# Patient Record
Sex: Male | Born: 1970 | Race: White | Hispanic: No | Marital: Married | Smoking: Never smoker
Health system: Southern US, Community
[De-identification: ages and names within clinical notes are randomized; demographics above are authoritative.]

## PROBLEM LIST (undated history)

## (undated) DIAGNOSIS — F419 Anxiety disorder, unspecified: Secondary | ICD-10-CM

## (undated) DIAGNOSIS — R3129 Other microscopic hematuria: Secondary | ICD-10-CM

## (undated) DIAGNOSIS — N201 Calculus of ureter: Secondary | ICD-10-CM

## (undated) DIAGNOSIS — R161 Splenomegaly, not elsewhere classified: Secondary | ICD-10-CM

## (undated) DIAGNOSIS — N200 Calculus of kidney: Secondary | ICD-10-CM

## (undated) DIAGNOSIS — E781 Pure hyperglyceridemia: Secondary | ICD-10-CM

## (undated) DIAGNOSIS — F329 Major depressive disorder, single episode, unspecified: Secondary | ICD-10-CM

## (undated) DIAGNOSIS — N289 Disorder of kidney and ureter, unspecified: Secondary | ICD-10-CM

## (undated) DIAGNOSIS — G2581 Restless legs syndrome: Secondary | ICD-10-CM

## (undated) DIAGNOSIS — R1011 Right upper quadrant pain: Secondary | ICD-10-CM

## (undated) DIAGNOSIS — J45909 Unspecified asthma, uncomplicated: Secondary | ICD-10-CM

## (undated) DIAGNOSIS — E669 Obesity, unspecified: Secondary | ICD-10-CM

## (undated) DIAGNOSIS — E785 Hyperlipidemia, unspecified: Secondary | ICD-10-CM

## (undated) DIAGNOSIS — H8110 Benign paroxysmal vertigo, unspecified ear: Secondary | ICD-10-CM

## (undated) DIAGNOSIS — G4730 Sleep apnea, unspecified: Secondary | ICD-10-CM

## (undated) DIAGNOSIS — R0982 Postnasal drip: Secondary | ICD-10-CM

## (undated) DIAGNOSIS — F32A Depression, unspecified: Secondary | ICD-10-CM

## (undated) DIAGNOSIS — R319 Hematuria, unspecified: Secondary | ICD-10-CM

## (undated) HISTORY — DX: Depression, unspecified: F32.A

## (undated) HISTORY — DX: Benign paroxysmal vertigo, unspecified ear: H81.10

## (undated) HISTORY — DX: Obesity, unspecified: E66.9

## (undated) HISTORY — DX: Sleep apnea, unspecified: G47.30

## (undated) HISTORY — DX: Unspecified asthma, uncomplicated: J45.909

## (undated) HISTORY — PX: LITHOTRIPSY: SUR834

## (undated) HISTORY — DX: Major depressive disorder, single episode, unspecified: F32.9

## (undated) HISTORY — DX: Right upper quadrant pain: R10.11

## (undated) HISTORY — DX: Postnasal drip: R09.82

## (undated) HISTORY — DX: Splenomegaly, not elsewhere classified: R16.1

## (undated) HISTORY — DX: Calculus of ureter: N20.1

## (undated) HISTORY — DX: Other microscopic hematuria: R31.29

## (undated) HISTORY — DX: Anxiety disorder, unspecified: F41.9

## (undated) HISTORY — DX: Hyperlipidemia, unspecified: E78.5

## (undated) HISTORY — DX: Calculus of kidney: N20.0

## (undated) HISTORY — DX: Restless legs syndrome: G25.81

## (undated) HISTORY — DX: Hematuria, unspecified: R31.9

## (undated) HISTORY — DX: Pure hyperglyceridemia: E78.1

---
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
## 1988-05-01 HISTORY — PX: TONSILLECTOMY: SUR1361

## 1988-05-01 HISTORY — PX: SINUS SURGERY WITH INSTATRAK: SHX5215

## 2004-05-01 HISTORY — PX: OTHER SURGICAL HISTORY: SHX169

## 2006-07-12 ENCOUNTER — Ambulatory Visit: Payer: Self-pay | Admitting: Gastroenterology

## 2006-07-26 ENCOUNTER — Ambulatory Visit: Payer: Self-pay | Admitting: Gastroenterology

## 2009-07-19 ENCOUNTER — Ambulatory Visit: Payer: Self-pay | Admitting: Specialist

## 2010-09-22 ENCOUNTER — Ambulatory Visit: Payer: Self-pay | Admitting: Otolaryngology

## 2012-04-19 ENCOUNTER — Ambulatory Visit: Payer: Self-pay | Admitting: Family Medicine

## 2012-04-22 ENCOUNTER — Emergency Department: Payer: Self-pay | Admitting: Emergency Medicine

## 2012-04-22 LAB — CBC WITH DIFFERENTIAL/PLATELET
Eosinophil %: 1.3 %
HCT: 45.7 % (ref 40.0–52.0)
HGB: 15.7 g/dL (ref 13.0–18.0)
Lymphocyte #: 1.3 10*3/uL (ref 1.0–3.6)
MCV: 82 fL (ref 80–100)
Monocyte %: 13.7 %
Neutrophil #: 4.3 10*3/uL (ref 1.4–6.5)
Platelet: 222 10*3/uL (ref 150–440)
RBC: 5.55 10*6/uL (ref 4.40–5.90)
WBC: 6.6 10*3/uL (ref 3.8–10.6)

## 2012-04-22 LAB — COMPREHENSIVE METABOLIC PANEL
Albumin: 4.1 g/dL (ref 3.4–5.0)
Anion Gap: 7 (ref 7–16)
Calcium, Total: 9 mg/dL (ref 8.5–10.1)
Chloride: 107 mmol/L (ref 98–107)
Co2: 26 mmol/L (ref 21–32)
Creatinine: 0.94 mg/dL (ref 0.60–1.30)
EGFR (African American): >60
EGFR (Non-African Amer.): >60
Osmolality: 279 (ref 275–301)
SGOT(AST): 27 U/L (ref 15–37)

## 2012-04-22 LAB — URINALYSIS, COMPLETE
Bilirubin,UR: NEGATIVE
Glucose,UR: NEGATIVE mg/dL (ref 0–75)
Ketone: NEGATIVE
Nitrite: NEGATIVE
Specific Gravity: 1.023 (ref 1.003–1.030)
WBC UR: 1 /HPF (ref 0–5)

## 2012-05-21 ENCOUNTER — Ambulatory Visit: Payer: Self-pay | Admitting: Surgery

## 2012-06-10 ENCOUNTER — Ambulatory Visit: Payer: Self-pay | Admitting: Unknown Physician Specialty

## 2012-06-12 LAB — PATHOLOGY REPORT

## 2013-12-27 IMAGING — US ABDOMEN ULTRASOUND
1 series · 14 of 25 positions shown · non-contrast
Comparison: none

REASON FOR EXAM: RUQ pain
COMMENTS:

[Series 1: abdomen ultrasound · 0.33mm/px · 14 of 133 slices shown]
[im 1/133]
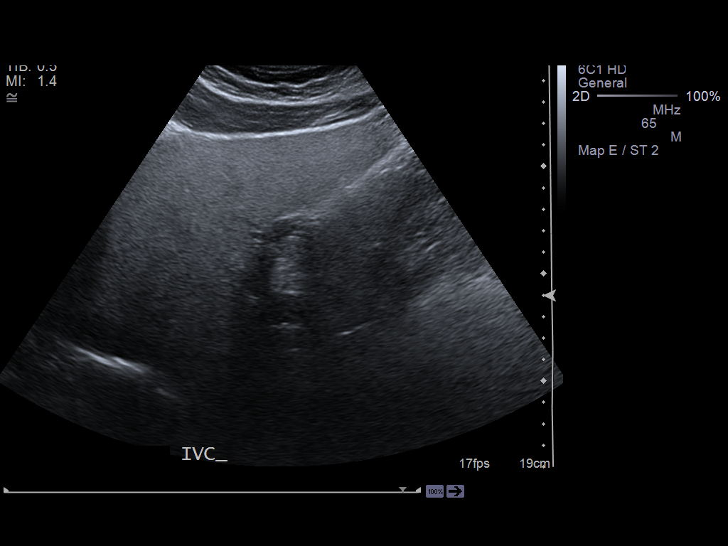
[im 12/133]
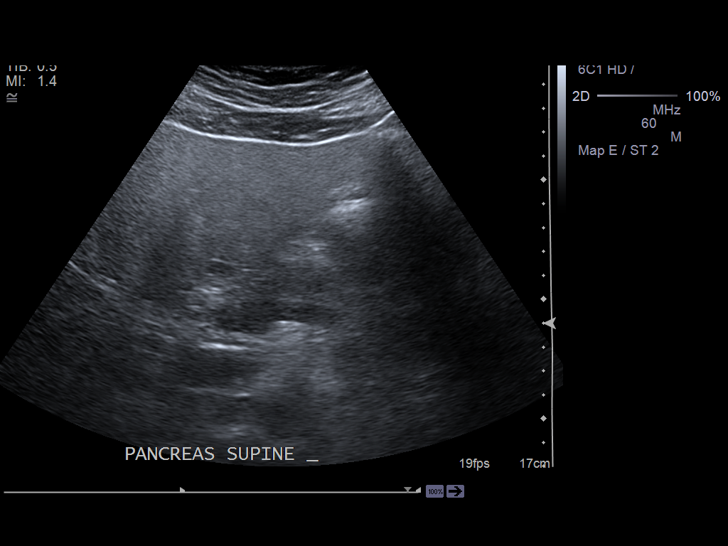
[im 23/133]
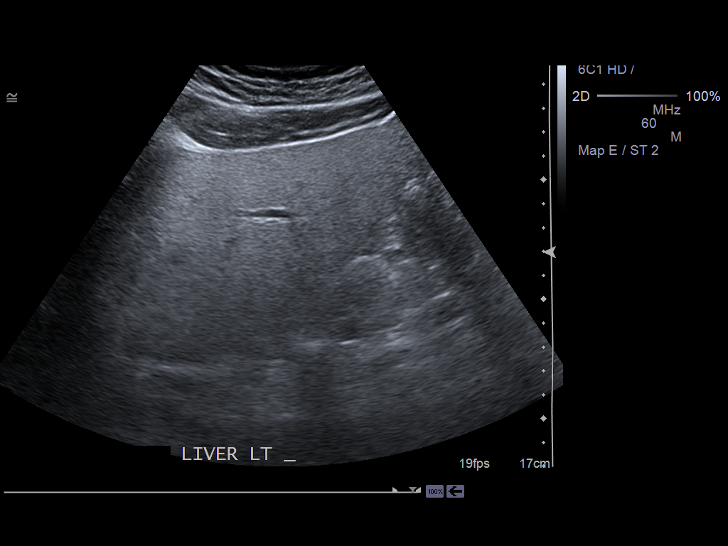
[im 34/133]
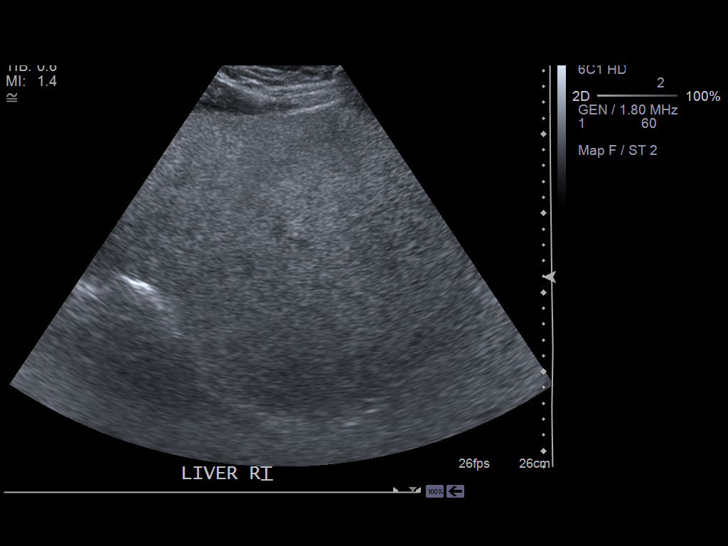
[im 45/133]
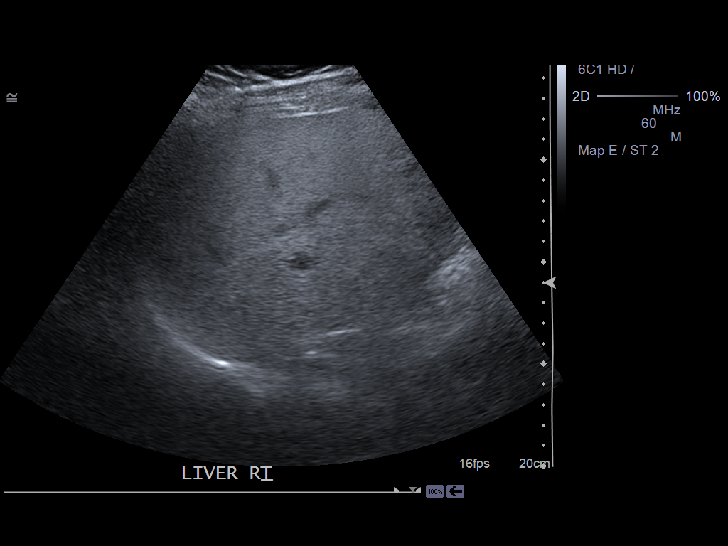
[im 50/133]
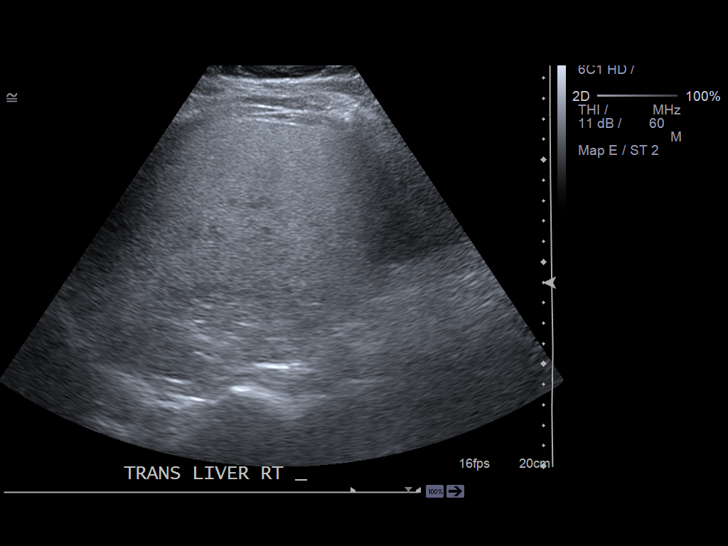
[im 61/133]
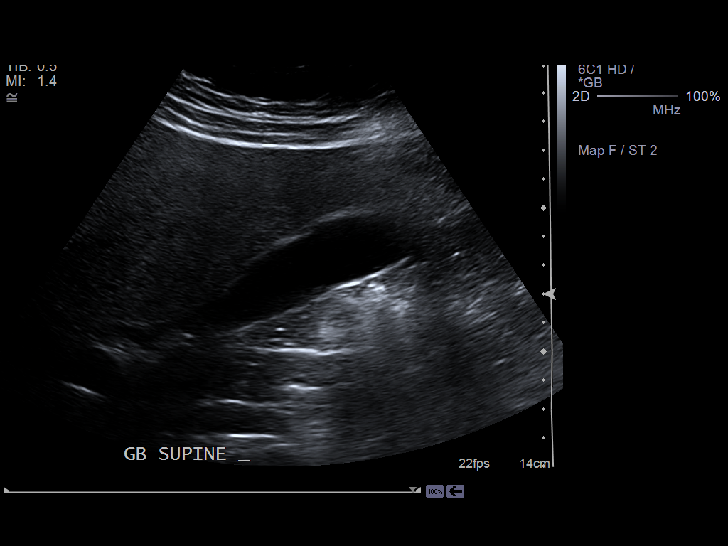
[im 72/133]
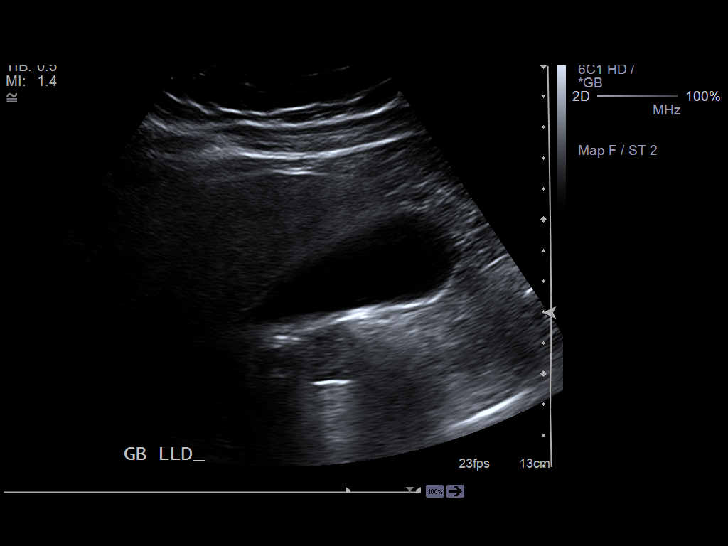
[im 83/133]
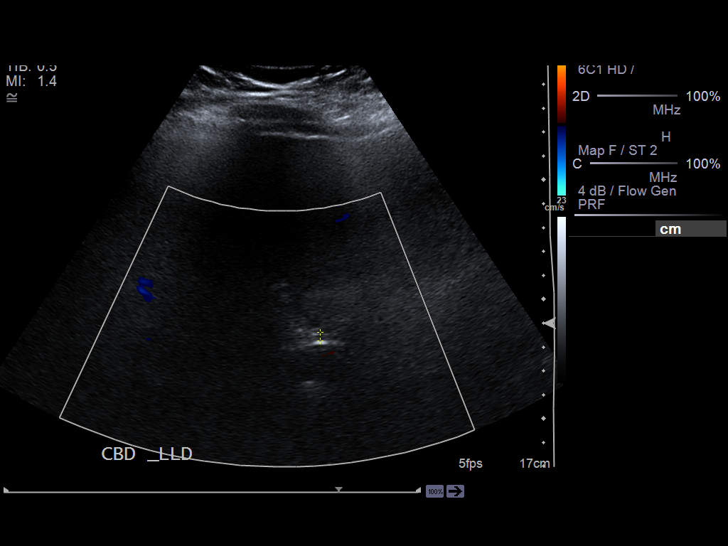
[im 89/133]
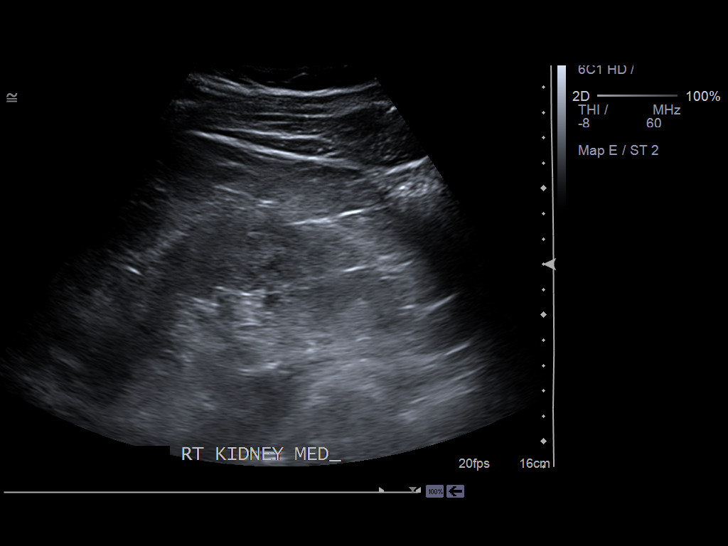
[im 100/133]
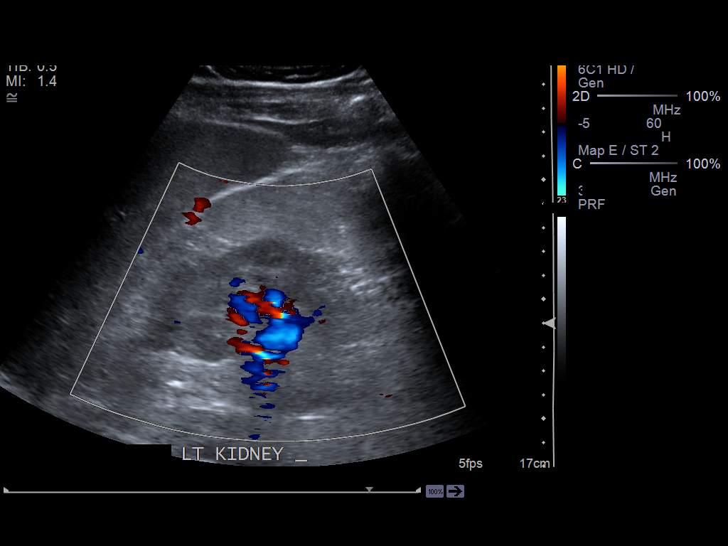
[im 111/133]
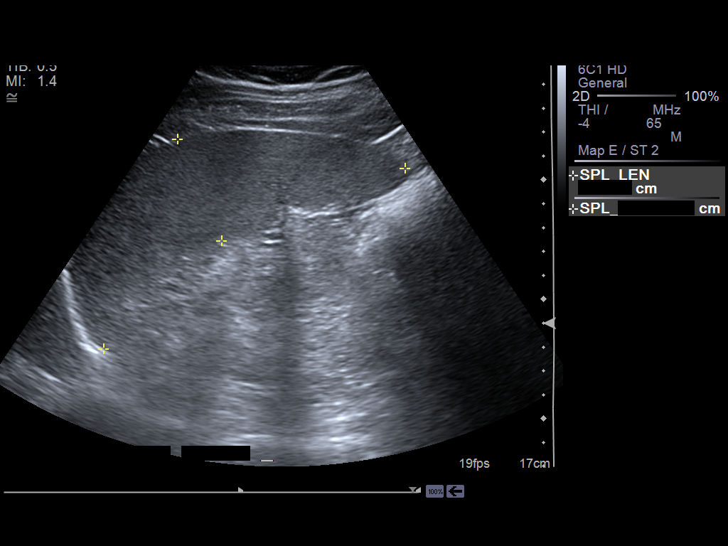
[im 122/133]
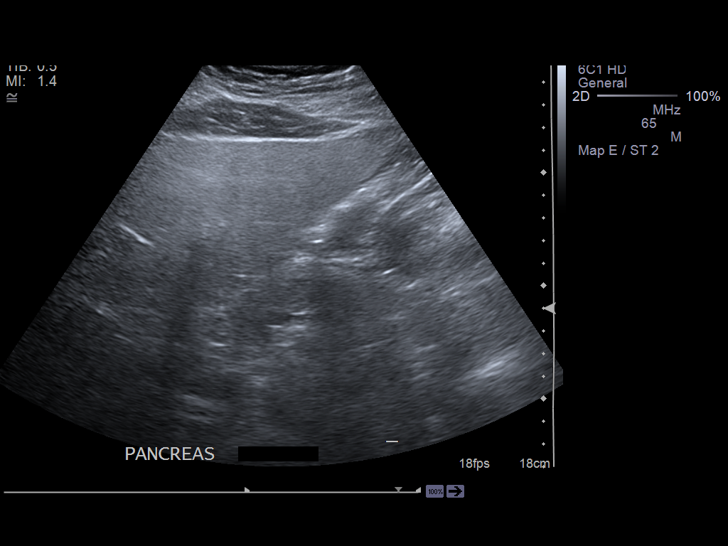
[im 133/133]
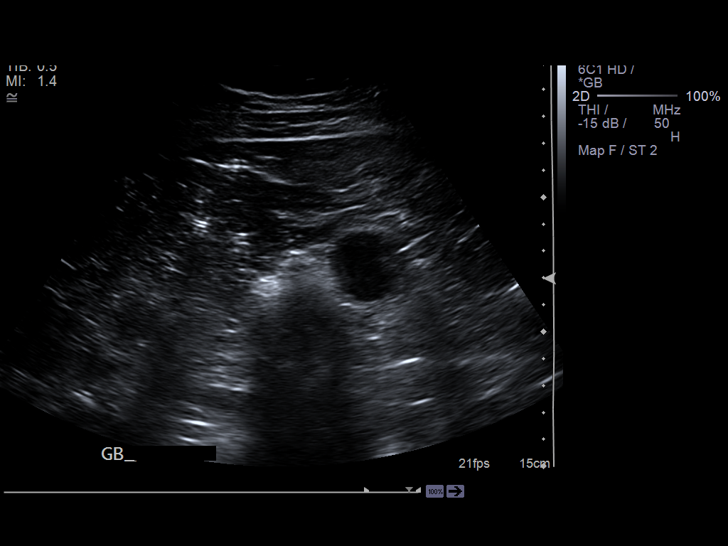

[14 of 25 positions shown; findings below may reference images not displayed]

PROCEDURE:     US  - US ABDOMEN GENERAL SURVEY  - April 19, 2012  [DATE]

RESULT:     The liver exhibits mildly increased echotexture suggesting fatty
infiltration. Portal venous flow is normal in direction toward the liver.
The spleen is enlarged measuring 14.7 cm in greatest dimension.

The gallbladder is adequately distended with no evidence of stones. A small
amount of echogenic bile versus sludge is present however. There is no
gallbladder wall thickening or pericholecystic fluid or positive sonographic
Murphy's sign. The common bile duct is normal at 3.6 mm in diameter.

The pancreatic head and body exhibit normal echotexture but the tail is
obscured by bowel gas. The kidneys, abdominal aorta, and inferior vena cava
are normal in appearance.
IMPRESSION: 1. There are fatty infiltrative changes of the liver. A small amount of
sludge within the gallbladder is suspected. There is no sonographic evidence
of acute cholecystitis.
2. There is splenomegaly.

[REDACTED]

## 2014-08-14 ENCOUNTER — Ambulatory Visit
Admit: 2014-08-14 | Disposition: A | Discharge status: Home / Self Care | Payer: Self-pay | Attending: Urology | Admitting: Urology

## 2014-08-17 ENCOUNTER — Ambulatory Visit
Admit: 2014-08-17 | Disposition: A | Discharge status: Home / Self Care | Payer: Self-pay | Attending: Urology | Admitting: Urology

## 2014-08-20 ENCOUNTER — Ambulatory Visit
Admit: 2014-08-20 | Disposition: A | Discharge status: Home / Self Care | Payer: Self-pay | Attending: Urology | Admitting: Urology

## 2014-09-01 ENCOUNTER — Ambulatory Visit
Admission: RE | Admit: 2014-09-01 | Discharge: 2014-09-01 | Disposition: A | Discharge status: Home / Self Care | Payer: No Typology Code available for payment source | Source: Ambulatory Visit | Attending: Urology | Admitting: Urology

## 2014-09-01 ENCOUNTER — Other Ambulatory Visit: Payer: Self-pay | Admitting: Urology

## 2014-09-01 DIAGNOSIS — IMO0001 Reserved for inherently not codable concepts without codable children: Secondary | ICD-10-CM

## 2014-09-01 DIAGNOSIS — N200 Calculus of kidney: Secondary | ICD-10-CM

## 2014-09-01 DIAGNOSIS — Z09 Encounter for follow-up examination after completed treatment for conditions other than malignant neoplasm: Secondary | ICD-10-CM | POA: Insufficient documentation

## 2014-10-21 ENCOUNTER — Other Ambulatory Visit: Payer: Self-pay | Admitting: *Deleted

## 2014-10-21 DIAGNOSIS — F32A Depression, unspecified: Secondary | ICD-10-CM

## 2014-10-21 DIAGNOSIS — F329 Major depressive disorder, single episode, unspecified: Secondary | ICD-10-CM

## 2014-10-21 MED ORDER — PAROXETINE HCL 20 MG PO TABS: ORAL_TABLET | ORAL | Status: DC

## 2014-12-09 ENCOUNTER — Telehealth: Payer: Self-pay | Admitting: Urology

## 2014-12-09 DIAGNOSIS — N200 Calculus of kidney: Secondary | ICD-10-CM

## 2014-12-09 NOTE — Telephone Encounter (Signed)
Schedule KUB

## 2014-12-11 ENCOUNTER — Encounter: Payer: Self-pay | Admitting: Emergency Medicine

## 2014-12-11 ENCOUNTER — Emergency Department
Admission: EM | Admit: 2014-12-11 | Discharge: 2014-12-11 | Disposition: A | Discharge status: Home / Self Care | Payer: No Typology Code available for payment source | Attending: Emergency Medicine | Admitting: Emergency Medicine

## 2014-12-11 ENCOUNTER — Emergency Department: Payer: No Typology Code available for payment source

## 2014-12-11 DIAGNOSIS — R509 Fever, unspecified: Secondary | ICD-10-CM | POA: Diagnosis present

## 2014-12-11 DIAGNOSIS — Z79899 Other long term (current) drug therapy: Secondary | ICD-10-CM | POA: Diagnosis not present

## 2014-12-11 DIAGNOSIS — F419 Anxiety disorder, unspecified: Secondary | ICD-10-CM | POA: Insufficient documentation

## 2014-12-11 HISTORY — DX: Disorder of kidney and ureter, unspecified: N28.9

## 2014-12-11 LAB — URINALYSIS COMPLETE WITH MICROSCOPIC (ARMC ONLY)
Bacteria, UA: NONE SEEN
Bilirubin Urine: NEGATIVE
GLUCOSE, UA: NEGATIVE mg/dL
HGB URINE DIPSTICK: NEGATIVE
Ketones, ur: NEGATIVE mg/dL
LEUKOCYTES UA: NEGATIVE
Nitrite: NEGATIVE
PH: 6 (ref 5.0–8.0)
Protein, ur: NEGATIVE mg/dL
RBC / HPF: NONE SEEN RBC/hpf (ref 0–5)
Specific Gravity, Urine: 1.021 (ref 1.005–1.030)

## 2014-12-11 NOTE — ED Notes (Signed)
Pt presents to the ed with c/o fevers/chills and back ache and pain, pt states he has been dealing with kidney stones for a few weeks now. Pt states he has been having these s/s for about 8 days and the kidney stones for about 8 weeks.

## 2014-12-11 NOTE — Discharge Instructions (Signed)
Fever, Adult °A fever is a temperature of 100.4° F (38° C) or above.  °HOME CARE °· Take fever medicine as told by your doctor. Do not  take aspirin for fever if you are younger than 46 years of age. °· If you are given antibiotic medicine, take it as told. Finish the medicine even if you start to feel better. °· Rest. °· Drink enough fluids to keep your pee (urine) clear or pale yellow. Do not drink alcohol. °· Take a bath or shower with room temperature water. Do not use ice water or alcohol sponge baths. °· Wear lightweight, loose clothes. °GET HELP RIGHT AWAY IF:  °· You are short of breath or have trouble breathing. °· You are very weak. °· You are dizzy or you pass out (faint). °· You are very thirsty or are making little or no urine. °· You have new pain. °· You throw up (vomit) or have watery poop (diarrhea). °· You keep throwing up or having watery poop for more than 1 to 2 days. °· You have a stiff neck or light bothers your eyes. °· You have a skin rash. °· You have a fever or problems (symptoms) that last for more than 2 to 3 days. °· You have a fever and your problems quickly get worse. °· You keep throwing up the fluids you drink. °· You do not feel better after 3 days. °· You have new problems. °MAKE SURE YOU:  °· Understand these instructions. °· Will watch your condition. °· Will get help right away if you are not doing well or get worse. °Document Released: 01/25/2008 Document Revised: 07/10/2011 Document Reviewed: 02/16/2011 °[employer]® Patient Information ©2015 [employer]. This information is not intended to replace advice given to you by your health care provider. Make sure you discuss any questions you have with your health care provider. ° °

## 2014-12-11 NOTE — ED Provider Notes (Signed)
[facility] Emergency Department Provider Note  ____________________________________________  Time seen: 12:30 PM  I have reviewed the triage vital signs and the nursing notes.   HISTORY  Chief Complaint Fever and Chills    HPI Kenneth Moreno is a 44 y.o. male who presents with complaints of fevers and chills intermittently for the last 8 days. He states he occasionally will break out in a sweat and one time when he did this his wife checked his temperature was 101. He is unable to get up with his PCP for another 3 weeks so he went to the urgent care today. After their exam they told him there was nothing I could do. Psychiatric the emergency department. He does plan of a mild intermittent cough. He denies dysuria. He reports a history of kidney stones and apparently called his urologist to see if he may have an infection. He is afebrile in the emergency department and has no complaints currently. No recent incarceration, no tick bites, no international travel     Past Medical History  Diagnosis Date  . Renal disorder     There are no active problems to display for this patient.   Past Surgical History  Procedure Laterality Date  . Sinus surgery with instatrak    . Lithotripsy      Current Outpatient Rx  Name  Route  Sig  Dispense  Refill  . ALPRAZolam (XANAX) 0.5 MG tablet   Oral   Take 0.5 mg by mouth 3 (three) times daily as needed for anxiety.         Marland Kitchen PARoxetine (PAXIL) 20 MG tablet      One Tab twice a day by mouth Patient taking differently: Take 20 mg by mouth 2 (two) times daily.    60 tablet   3     Allergies Review of patient's allergies indicates no known allergies.  No family history on file.  Social History Social History  Substance Use Topics  . Smoking status: Never Smoker   . Smokeless tobacco: None  . Alcohol Use: No    Review of Systems  Constitutional: Positive for fever, positive for chills Eyes:  Negative for visual changes. ENT: Negative for sore throat Cardiovascular: Negative for chest pain. Respiratory: Negative for shortness of breath. Gastrointestinal: Negative for abdominal pain, vomiting and diarrhea. Genitourinary: Negative for dysuria. Musculoskeletal: Negative for back pain. Skin: Negative for rash. Neurological: Negative for headaches or focal weakness Psychiatric: Positive for anxiety    ____________________________________________   PHYSICAL EXAM:  VITAL SIGNS: ED Triage Vitals  Enc Vitals Group     BP 12/11/14 1024 134/97 mmHg     Pulse Rate 12/11/14 1024 96     Resp 12/11/14 1024 18     Temp 12/11/14 1024 98.6 F (37 C)     Temp Source 12/11/14 1024 Oral     SpO2 12/11/14 1024 96 %     Weight 12/11/14 1024 254 lb (115.214 kg)     Height 12/11/14 1024 6\' 1"  (1.854 m)     Head Cir --      Peak Flow --      Pain Score 12/11/14 1024 0     Pain Loc --      Pain Edu? --      Excl. in [place]? --      Constitutional: Alert and oriented. Well appearing and in no distress. Eyes: Conjunctivae are normal.  ENT   Head: Normocephalic and atraumatic.   Mouth/Throat: Mucous  membranes are moist. Cardiovascular: Normal rate, regular rhythm. Normal and symmetric distal pulses are present in all extremities. No murmurs, rubs, or gallops. Respiratory: Normal respiratory effort without tachypnea nor retractions. Breath sounds are clear and equal bilaterally.  Gastrointestinal: Soft and non-tender in all quadrants. No distention. There is no CVA tenderness. Genitourinary: deferred Musculoskeletal: Nontender with normal range of motion in all extremities. No lower extremity tenderness nor edema. Neurologic:  Normal speech and language. No gross focal neurologic deficits are appreciated. Skin:  Skin is warm, dry and intact. No rash noted. Psychiatric: Mood and affect are normal. Patient exhibits appropriate insight and  judgment.  ____________________________________________    LABS (pertinent positives/negatives)  Labs Reviewed  URINALYSIS COMPLETEWITH MICROSCOPIC ([place])    ____________________________________________   EKG  None ____________________________________________    RADIOLOGY I have personally reviewed any xrays that were ordered on this patient: Chest x-ray unremarkable  ____________________________________________   PROCEDURES  Procedure(s) performed: none  Critical Care performed: none  ____________________________________________   INITIAL IMPRESSION / ASSESSMENT AND PLAN / ED COURSE  Pertinent labs & imaging results that were available during my care of the patient were reviewed by me and considered in my medical decision making (see chart for details).  Unclear cause of patient's symptoms. His exam is benign. His vitals are unremarkable. His chest x-ray and urine are normal. I will discharge him and have him follow-up with PCP  ____________________________________________   FINAL CLINICAL IMPRESSION(S) / ED DIAGNOSES  Final diagnoses:  Fever and chills     Kenneth Drafts, MD 12/11/14 1530

## 2014-12-11 NOTE — ED Notes (Signed)
Pt to ed with c/o fever, chills and sweats x several months,  Pt states he has been seen by multiple doctors and had multiple tests run without any answers.  Pt states he has had fever for the last 8 days, states last dose of tylenol was at 9 am today.  States has appt with "specialist" in sept.  Pt appears in nad at this time.  No sweats noted at this time.

## 2014-12-21 ENCOUNTER — Ambulatory Visit: Payer: Self-pay | Admitting: Family Medicine

## 2014-12-21 ENCOUNTER — Encounter: Payer: Self-pay | Admitting: Family Medicine

## 2014-12-21 ENCOUNTER — Ambulatory Visit (INDEPENDENT_AMBULATORY_CARE_PROVIDER_SITE_OTHER): Payer: No Typology Code available for payment source | Admitting: Family Medicine

## 2014-12-21 VITALS — BP 140/80 | HR 86 | Temp 98.3°F | Resp 18 | Ht 73.0 in | Wt 252.6 lb

## 2014-12-21 DIAGNOSIS — R61 Generalized hyperhidrosis: Secondary | ICD-10-CM

## 2014-12-21 DIAGNOSIS — J45909 Unspecified asthma, uncomplicated: Secondary | ICD-10-CM | POA: Insufficient documentation

## 2014-12-21 DIAGNOSIS — R198 Other specified symptoms and signs involving the digestive system and abdomen: Secondary | ICD-10-CM | POA: Insufficient documentation

## 2014-12-21 DIAGNOSIS — F411 Generalized anxiety disorder: Secondary | ICD-10-CM | POA: Insufficient documentation

## 2014-12-21 DIAGNOSIS — F32A Depression, unspecified: Secondary | ICD-10-CM | POA: Insufficient documentation

## 2014-12-21 DIAGNOSIS — F329 Major depressive disorder, single episode, unspecified: Secondary | ICD-10-CM | POA: Insufficient documentation

## 2014-12-21 DIAGNOSIS — E785 Hyperlipidemia, unspecified: Secondary | ICD-10-CM | POA: Insufficient documentation

## 2014-12-21 DIAGNOSIS — R3129 Other microscopic hematuria: Secondary | ICD-10-CM | POA: Insufficient documentation

## 2014-12-21 MED ORDER — ONDANSETRON HCL 4 MG PO TABS: 4.0000 mg | ORAL_TABLET | Freq: Three times a day (TID) | ORAL | Status: DC | PRN

## 2014-12-21 NOTE — Progress Notes (Signed)
Name: Kenneth Moreno   MRN: [id number]    DOB: 10/07/1970   Date:12/21/2014       Progress Note  Subjective  Chief Complaint  Chief Complaint  Patient presents with  . Follow-up    ER [facility]) Fever/sweats x2-3 nights a week, vomiting and diarrhea  . Asthma    HPI Pt. Presents with a four-week history of increased sweating (feeling like a hot flash), followed by feeling frozen cold, nausea (with some vomiting), diarrhea (for four days) and malaise. Pt. Has been seen in Urgent care and [facility] ER for similar symptoms and after testing (Urinalysis and X rays), no cause has been identified for his symptoms. He feels better in terms of night-sweats but nausea has gotten worse. He is otherwise healthy person and has never been sick for this long before.   Past Medical History  Diagnosis Date  . Renal disorder     Past Surgical History  Procedure Laterality Date  . Sinus surgery with instatrak    . Lithotripsy      History reviewed. No pertinent family history.  Social History   Social History  . Marital Status: Married    Spouse Name: N/A  . Number of Children: N/A  . Years of Education: N/A   Occupational History  . Not on file.   Social History Main Topics  . Smoking status: Never Smoker   . Smokeless tobacco: Not on file  . Alcohol Use: No  . Drug Use: No  . Sexual Activity: Not on file   Other Topics Concern  . Not on file   Social History Narrative     Current outpatient prescriptions:  .  ALPRAZolam (XANAX) 0.5 MG tablet, Take 0.5 mg by mouth 3 (three) times daily as needed for anxiety., Disp: , Rfl:  .  clonazePAM (KLONOPIN) 0.5 MG tablet, Take by mouth., Disp: , Rfl:  .  FLUTICASONE PROPIONATE HFA IN, Place into the nose., Disp: , Rfl:  .  Fluticasone-Salmeterol (ADVAIR DISKUS) 250-50 MCG/DOSE AEPB, Inhale into the lungs., Disp: , Rfl:  .  levalbuterol (XOPENEX HFA) 45 MCG/ACT inhaler, Inhale into the lungs., Disp: , Rfl:  .  PARoxetine (PAXIL) 20 MG  tablet, One Tab twice a day by mouth (Patient taking differently: Take 20 mg by mouth 2 (two) times daily. ), Disp: 60 tablet, Rfl: 3  No Known Allergies   Review of Systems  Constitutional: Positive for chills (improved), weight loss and malaise/fatigue. Negative for fever.  HENT: Negative for sore throat.   Respiratory: Negative for cough, sputum production and shortness of breath.   Cardiovascular: Negative for chest pain.  Gastrointestinal: Positive for nausea, vomiting and diarrhea.  Genitourinary: Negative for dysuria, urgency, frequency and hematuria.  Neurological: Negative for headaches.      Objective  Filed Vitals:   12/21/14 1546  BP: 140/80  Pulse: 86  Temp: 98.3 F (36.8 C)  TempSrc: Oral  Resp: 18  Height: 6\' 1"  (1.854 m)  Weight: 252 lb 9.6 oz (114.579 kg)  SpO2: 95%    Physical Exam  Constitutional: He is oriented to person, place, and time and well-developed, well-nourished, and in no distress.  HENT:  Head: Normocephalic and atraumatic.  Neck: Normal range of motion.  Cardiovascular: Normal rate and regular rhythm.   Pulmonary/Chest: Effort normal and breath sounds normal.  Abdominal: Soft. Bowel sounds are normal.  Neurological: He is alert and oriented to person, place, and time.  Psychiatric: Affect and judgment normal.  Nursing note and  vitals reviewed.   Assessment & Plan  1. Gastrointestinal complaints Obtained a complete workup for GI complaints of nausea, vomiting, diarrhea, fevers and chills. Started patient on an antiemetic. Follow-up after completion of workup. - CBC with Differential - Comprehensive metabolic panel - Amylase - Lipase - TSH - Stool C-Diff Toxin Assay - Stool Culture - Urinalysis, Routine w reflex microscopic - US Abdomen Complete; Future - ondansetron (ZOFRAN) 4 MG tablet; Take 1 tablet (4 mg total) by mouth every 8 (eight) hours as needed for nausea or vomiting.  Dispense: 30 tablet; Refill: 0  2. Unexplained  night sweats  - PPD   Lavaris Sexson [facility] 12/21/2014 4:10 PM

## 2014-12-23 ENCOUNTER — Encounter: Payer: Self-pay | Admitting: *Deleted

## 2014-12-23 ENCOUNTER — Other Ambulatory Visit: Payer: Self-pay

## 2014-12-23 DIAGNOSIS — N200 Calculus of kidney: Secondary | ICD-10-CM

## 2014-12-24 ENCOUNTER — Ambulatory Visit
Admission: RE | Admit: 2014-12-24 | Discharge: 2014-12-24 | Disposition: A | Discharge status: Home / Self Care | Payer: No Typology Code available for payment source | Source: Ambulatory Visit | Attending: Family Medicine | Admitting: Family Medicine

## 2014-12-24 DIAGNOSIS — R198 Other specified symptoms and signs involving the digestive system and abdomen: Secondary | ICD-10-CM | POA: Insufficient documentation

## 2014-12-24 DIAGNOSIS — R161 Splenomegaly, not elsewhere classified: Secondary | ICD-10-CM | POA: Diagnosis not present

## 2014-12-24 DIAGNOSIS — K760 Fatty (change of) liver, not elsewhere classified: Secondary | ICD-10-CM | POA: Insufficient documentation

## 2014-12-24 LAB — TB SKIN TEST
INDURATION: 0 mm
TB Skin Test: NEGATIVE

## 2014-12-25 ENCOUNTER — Telehealth: Payer: Self-pay | Admitting: Family Medicine

## 2014-12-25 NOTE — Telephone Encounter (Signed)
Results have been reported to patient 

## 2014-12-25 NOTE — Telephone Encounter (Signed)
Kenneth Moreno (wife) is requesting a return call, requesting xray results [phone]  EXT [phone]

## 2014-12-29 LAB — CBC WITH DIFFERENTIAL/PLATELET
BASOS: 1 %
Basophils Absolute: 0 10*3/uL (ref 0.0–0.2)
EOS (ABSOLUTE): 0.1 10*3/uL (ref 0.0–0.4)
EOS: 2 %
Hematocrit: 40.1 % (ref 37.5–51.0)
Hemoglobin: 13.7 g/dL (ref 12.6–17.7)
IMMATURE GRANS (ABS): 0 10*3/uL (ref 0.0–0.1)
IMMATURE GRANULOCYTES: 0 %
Lymphocytes Absolute: 3 10*3/uL (ref 0.7–3.1)
Lymphs: 56 %
MCH: 28 pg (ref 26.6–33.0)
MCHC: 34.2 g/dL (ref 31.5–35.7)
MCV: 82 fL (ref 79–97)
Monocytes Absolute: 0.7 10*3/uL (ref 0.1–0.9)
Monocytes: 12 %
NEUTROS PCT: 29 %
Neutrophils Absolute: 1.6 10*3/uL (ref 1.4–7.0)
Platelets: 227 10*3/uL (ref 150–379)
RBC: 4.89 x10E6/uL (ref 4.14–5.80)
RDW: 15.5 % — ABNORMAL HIGH (ref 12.3–15.4)
WBC: 5.4 10*3/uL (ref 3.4–10.8)

## 2014-12-29 LAB — COMPREHENSIVE METABOLIC PANEL
A/G RATIO: 2 (ref 1.1–2.5)
ALBUMIN: 4.3 g/dL (ref 3.5–5.5)
ALT: 41 IU/L (ref 0–44)
AST: 23 IU/L (ref 0–40)
Alkaline Phosphatase: 62 IU/L (ref 39–117)
BUN / CREAT RATIO: 13 (ref 9–20)
BUN: 13 mg/dL (ref 6–24)
Bilirubin Total: 0.5 mg/dL (ref 0.0–1.2)
CALCIUM: 9.2 mg/dL (ref 8.7–10.2)
CO2: 24 mmol/L (ref 18–29)
Chloride: 101 mmol/L (ref 97–108)
Creatinine, Ser: 1.01 mg/dL (ref 0.76–1.27)
GFR, EST AFRICAN AMERICAN: 103 mL/min/{1.73_m2} (ref >59)
GFR, EST NON AFRICAN AMERICAN: 89 mL/min/{1.73_m2} (ref >59)
GLOBULIN, TOTAL: 2.1 g/dL (ref 1.5–4.5)
Glucose: 105 mg/dL — ABNORMAL HIGH (ref 65–99)
POTASSIUM: 5.4 mmol/L — AB (ref 3.5–5.2)
SODIUM: 141 mmol/L (ref 134–144)
TOTAL PROTEIN: 6.4 g/dL (ref 6.0–8.5)

## 2014-12-29 LAB — LIPASE: Lipase: 51 U/L (ref 0–59)

## 2014-12-29 LAB — URINALYSIS, ROUTINE W REFLEX MICROSCOPIC
Bilirubin, UA: NEGATIVE
GLUCOSE, UA: NEGATIVE
Ketones, UA: NEGATIVE
Leukocytes, UA: NEGATIVE
NITRITE UA: NEGATIVE
Protein, UA: NEGATIVE
RBC, UA: NEGATIVE
Specific Gravity, UA: 1.02 (ref 1.005–1.030)
UUROB: 0.2 mg/dL (ref 0.2–1.0)
pH, UA: 6 (ref 5.0–7.5)

## 2014-12-29 LAB — TSH: TSH: 0.811 u[IU]/mL (ref 0.450–4.500)

## 2014-12-29 LAB — AMYLASE: Amylase: 75 U/L (ref 31–124)

## 2014-12-30 ENCOUNTER — Telehealth: Payer: Self-pay | Admitting: Family Medicine

## 2014-12-30 NOTE — Telephone Encounter (Signed)
Checking status on his blood work that was done on Monday 12-28-14. Please return call, if he does not answer it is okay to speak with his wife Patient #: [phone] Wife #: [phone]

## 2014-12-31 ENCOUNTER — Telehealth: Payer: Self-pay | Admitting: Family Medicine

## 2014-12-31 ENCOUNTER — Ambulatory Visit: Payer: Self-pay | Admitting: Urology

## 2014-12-31 NOTE — Telephone Encounter (Signed)
Pt would like a call back to have someone explain more in depth about his lab results. He is a little frustrated that he was not informed about what the next step is in his care and would like a call back as soon as possible for he is still having the same symptoms.

## 2014-12-31 NOTE — Telephone Encounter (Signed)
Spoke with patient this morning at 9:18am and he is coming to pick up lab orders for stool culture, results from previous test were reported to patient, apologized to patient and he stated he was never frustrated neither did he call here upset, we just kept missing each others phone calls he is ok and will come in today 12/31/2014 to pick up lab orders for stool culture

## 2014-12-31 NOTE — Telephone Encounter (Signed)
Spoke with patient and results have been reported  °

## 2015-01-01 ENCOUNTER — Encounter: Payer: Self-pay | Admitting: Urology

## 2015-01-14 ENCOUNTER — Telehealth: Payer: Self-pay | Admitting: Family Medicine

## 2015-01-14 NOTE — Telephone Encounter (Signed)
HAS TEST RESULTS BACK AND NEEDS TO ASK A QUESTION

## 2015-01-25 ENCOUNTER — Ambulatory Visit (INDEPENDENT_AMBULATORY_CARE_PROVIDER_SITE_OTHER): Payer: No Typology Code available for payment source | Admitting: Family Medicine

## 2015-01-25 ENCOUNTER — Encounter: Payer: Self-pay | Admitting: Family Medicine

## 2015-01-25 VITALS — BP 140/77 | HR 66 | Temp 98.0°F | Resp 18 | Ht 73.0 in | Wt 244.4 lb

## 2015-01-25 DIAGNOSIS — Z23 Encounter for immunization: Secondary | ICD-10-CM | POA: Diagnosis not present

## 2015-01-25 DIAGNOSIS — R161 Splenomegaly, not elsewhere classified: Secondary | ICD-10-CM | POA: Insufficient documentation

## 2015-01-25 NOTE — Progress Notes (Signed)
Name: Kenneth Moreno   MRN: [id number]    DOB: [birth year]   Date:01/25/2015       Progress Note  Subjective  Chief Complaint  Chief Complaint  Patient presents with  . Follow-up    Test results  . Asthma  . Anxiety    HPI   Enlarged Spleen  Pt. Is here for follow up of Abdominal Ultrasound results. This was ordered in August for evaluation of GI symptoms including nausea, vomiting, and diarrhea. His GI symptoms have resolved. Ultrasound showed an enlarged spleen measuring 13cm in maximal length and 720 cc volume. Pt. feels something pushing against his ribs on the left side. No fevers, chills, weight loss, or other constitutional symptoms.   Past Medical History  Diagnosis Date  . Renal disorder   . Splenomegaly   . Asthma   . Hematuria   . HLD (hyperlipidemia)   . RUQ pain   . Anxiety   . Restless leg   . Sleep apnea   . Vertigo, benign positional   . Depression   . Post-nasal drainage   . Airway hyperreactivity   . Hypertriglyceridemia   . Kidney stone on right side   . Microscopic hematuria   . Right ureteral stone   . Obesity   . HTN (hypertension)     Past Surgical History  Procedure Laterality Date  . Sinus surgery with instatrak  1990  . Lithotripsy    . Ganglion cyst removal  2006  . Tonsillectomy  1990    Family History  Problem Relation Age of Onset  . Colon cancer Brother   . Colon cancer Maternal Uncle   . Diabetes Mellitus II Father   . Hypertension Daughter     Social History   Social History  . Marital Status: Married    Spouse Name: N/A  . Number of Children: N/A  . Years of Education: N/A   Occupational History  . Not on file.   Social History Main Topics  . Smoking status: Never Smoker   . Smokeless tobacco: Not on file  . Alcohol Use: No  . Drug Use: No  . Sexual Activity: Not on file   Other Topics Concern  . Not on file   Social History Narrative     Current outpatient prescriptions:  .  ALPRAZolam (XANAX) 0.5  MG tablet, Take 0.5 mg by mouth 3 (three) times daily as needed for anxiety., Disp: , Rfl:  .  clonazePAM (KLONOPIN) 0.5 MG tablet, Take by mouth., Disp: , Rfl:  .  FLUTICASONE PROPIONATE HFA IN, Place into the nose., Disp: , Rfl:  .  levalbuterol (XOPENEX HFA) 45 MCG/ACT inhaler, Inhale into the lungs., Disp: , Rfl:  .  ondansetron (ZOFRAN) 4 MG tablet, Take 1 tablet (4 mg total) by mouth every 8 (eight) hours as needed for nausea or vomiting., Disp: 30 tablet, Rfl: 0 .  PARoxetine (PAXIL) 20 MG tablet, One Tab twice a day by mouth (Patient taking differently: Take 20 mg by mouth 2 (two) times daily. ), Disp: 60 tablet, Rfl: 3 .  Fluticasone-Salmeterol (ADVAIR DISKUS) 250-50 MCG/DOSE AEPB, Inhale into the lungs., Disp: , Rfl:  .  tamsulosin (FLOMAX) 0.4 MG CAPS capsule, Take 0.4 mg by mouth., Disp: , Rfl:   No Known Allergies  Review of Systems  Constitutional: Positive for malaise/fatigue. Negative for fever, chills and weight loss.  Gastrointestinal: Negative for nausea, vomiting, abdominal pain and diarrhea.   Objective  Filed Vitals:   01/25/15 0810  BP: 140/77  Pulse: 66  Temp: 98 F (36.7 C)  TempSrc: Oral  Resp: 18  Height: 6\' 1"  (1.854 m)  Weight: 244 lb 6.4 oz (110.859 kg)  SpO2: 95%    Physical Exam  Constitutional: He is well-developed, well-nourished, and in no distress.  Cardiovascular: Normal rate and regular rhythm.   Pulmonary/Chest: Effort normal and breath sounds normal.  Abdominal: Soft. Bowel sounds are normal. There is no tenderness.  Mild discomfort on palpation in the left upper quadrant.  Nursing note and vitals reviewed.   Recent Results (from the past 2160 hour(s))  Urinalysis complete, with microscopic ([facility] only)     Status: Abnormal   Collection Time: 12/11/14  2:06 PM  Result Value Ref Range   Color, Urine YELLOW (A) YELLOW   APPearance CLEAR (A) CLEAR   Glucose, UA NEGATIVE NEGATIVE mg/dL   Bilirubin Urine NEGATIVE NEGATIVE   Ketones, ur  NEGATIVE NEGATIVE mg/dL   Specific Gravity, Urine 1.021 1.005 - 1.030   Hgb urine dipstick NEGATIVE NEGATIVE   pH 6.0 5.0 - 8.0   Protein, ur NEGATIVE NEGATIVE mg/dL   Nitrite NEGATIVE NEGATIVE   Leukocytes, UA NEGATIVE NEGATIVE   RBC / HPF NONE SEEN 0 - 5 RBC/hpf   WBC, UA 0-5 0 - 5 WBC/hpf   Bacteria, UA NONE SEEN NONE SEEN   Squamous Epithelial / LPF 0-5 (A) NONE SEEN   Mucous PRESENT   PPD     Status: Normal   Collection Time: 12/24/14  8:39 AM  Result Value Ref Range   TB Skin Test Negative    Induration 0 mm    Comment: None  CBC with Differential     Status: Abnormal   Collection Time: 12/28/14 10:17 AM  Result Value Ref Range   WBC 5.4 3.4 - 10.8 x10E3/uL   RBC 4.89 4.14 - 5.80 x10E6/uL   Hemoglobin 13.7 12.6 - 17.7 g/dL   Hematocrit 40.1 37.5 - 51.0 %   MCV 82 79 - 97 fL   MCH 28.0 26.6 - 33.0 pg   MCHC 34.2 31.5 - 35.7 g/dL   RDW 15.5 (H) 12.3 - 15.4 %   Platelets 227 150 - 379 x10E3/uL   Neutrophils 29 %   Lymphs 56 %   Monocytes 12 %   Eos 2 %   Basos 1 %   Neutrophils Absolute 1.6 1.4 - 7.0 x10E3/uL   Lymphocytes Absolute 3.0 0.7 - 3.1 x10E3/uL   Monocytes Absolute 0.7 0.1 - 0.9 x10E3/uL   EOS (ABSOLUTE) 0.1 0.0 - 0.4 x10E3/uL   Basophils Absolute 0.0 0.0 - 0.2 x10E3/uL   Immature Granulocytes 0 %   Immature Grans (Abs) 0.0 0.0 - 0.1 x10E3/uL  Comprehensive metabolic panel     Status: Abnormal   Collection Time: 12/28/14 10:17 AM  Result Value Ref Range   Glucose 105 (H) 65 - 99 mg/dL    Comment: Specimen received in contact with cells. No visible hemolysis present. However GLUC may be decreased and K increased. Clinical correlation indicated.    BUN 13 6 - 24 mg/dL   Creatinine, Ser 1.01 0.76 - 1.27 mg/dL   GFR calc non Af Amer 89 >59 mL/min/1.73   GFR calc Af Amer 103 >59 mL/min/1.73   BUN/Creatinine Ratio 13 9 - 20   Sodium 141 134 - 144 mmol/L   Potassium 5.4 (H) 3.5 - 5.2 mmol/L   Chloride 101 97 - 108 mmol/L   CO2 24 18 - 29 mmol/L  Calcium 9.2 8.7 - 10.2 mg/dL   Total Protein 6.4 6.0 - 8.5 g/dL   Albumin 4.3 3.5 - 5.5 g/dL   Globulin, Total 2.1 1.5 - 4.5 g/dL   Albumin/Globulin Ratio 2.0 1.1 - 2.5   Bilirubin Total 0.5 0.0 - 1.2 mg/dL   Alkaline Phosphatase 62 39 - 117 IU/L   AST 23 0 - 40 IU/L   ALT 41 0 - 44 IU/L  Amylase     Status: None   Collection Time: 12/28/14 10:17 AM  Result Value Ref Range   Amylase 75 31 - 124 U/L  Lipase     Status: None   Collection Time: 12/28/14 10:17 AM  Result Value Ref Range   Lipase 51 0 - 59 U/L  TSH     Status: None   Collection Time: 12/28/14 10:17 AM  Result Value Ref Range   TSH 0.811 0.450 - 4.500 uIU/mL  Urinalysis, Routine w reflex microscopic     Status: None   Collection Time: 12/28/14 10:17 AM  Result Value Ref Range   Specific Gravity, UA 1.020 1.005 - 1.030   pH, UA 6.0 5.0 - 7.5   Color, UA Yellow Yellow   Appearance Ur Clear Clear   Leukocytes, UA Negative Negative   Protein, UA Negative Negative/Trace   Glucose, UA Negative Negative   Ketones, UA Negative Negative   RBC, UA Negative Negative   Bilirubin, UA Negative Negative   Urobilinogen, Ur 0.2 0.2 - 1.0 mg/dL   Nitrite, UA Negative Negative   Microscopic Examination Comment     Comment: Microscopic not indicated and not performed.   Assessment & Plan  1. Need for immunization against influenza  - Flu Vaccine QUAD 36+ mos IM  2. Splenomegaly  - Ambulatory referral to Hematology    [facility] 01/25/2015 8:23 AM

## 2015-01-29 ENCOUNTER — Inpatient Hospital Stay: Payer: No Typology Code available for payment source

## 2015-01-29 ENCOUNTER — Inpatient Hospital Stay: Payer: No Typology Code available for payment source | Attending: Internal Medicine | Admitting: Internal Medicine

## 2015-01-29 DIAGNOSIS — F329 Major depressive disorder, single episode, unspecified: Secondary | ICD-10-CM | POA: Diagnosis not present

## 2015-01-29 DIAGNOSIS — E781 Pure hyperglyceridemia: Secondary | ICD-10-CM | POA: Diagnosis not present

## 2015-01-29 DIAGNOSIS — G2581 Restless legs syndrome: Secondary | ICD-10-CM | POA: Insufficient documentation

## 2015-01-29 DIAGNOSIS — E669 Obesity, unspecified: Secondary | ICD-10-CM | POA: Diagnosis not present

## 2015-01-29 DIAGNOSIS — Z79899 Other long term (current) drug therapy: Secondary | ICD-10-CM | POA: Insufficient documentation

## 2015-01-29 DIAGNOSIS — I10 Essential (primary) hypertension: Secondary | ICD-10-CM | POA: Diagnosis not present

## 2015-01-29 DIAGNOSIS — Z87442 Personal history of urinary calculi: Secondary | ICD-10-CM | POA: Insufficient documentation

## 2015-01-29 DIAGNOSIS — C8590 Non-Hodgkin lymphoma, unspecified, unspecified site: Secondary | ICD-10-CM

## 2015-01-29 DIAGNOSIS — J45909 Unspecified asthma, uncomplicated: Secondary | ICD-10-CM | POA: Diagnosis not present

## 2015-01-29 DIAGNOSIS — F419 Anxiety disorder, unspecified: Secondary | ICD-10-CM | POA: Diagnosis not present

## 2015-01-29 DIAGNOSIS — G4730 Sleep apnea, unspecified: Secondary | ICD-10-CM | POA: Diagnosis not present

## 2015-01-29 DIAGNOSIS — R161 Splenomegaly, not elsewhere classified: Secondary | ICD-10-CM | POA: Insufficient documentation

## 2015-01-29 LAB — LACTATE DEHYDROGENASE: LDH: 160 U/L (ref 98–192)

## 2015-01-29 NOTE — Progress Notes (Signed)
[place] NOTE  Patient Care Team: Roselee Nova, MD as PCP - General (Family Medicine) Roselee Nova, MD (Family Medicine)  CHIEF COMPLAINTS/PURPOSE OF CONSULTATION:  Splenomegaly.  HISTORY OF PRESENTING ILLNESS:  Kenneth Moreno 44 y.o. male noted to have a splenomegaly on ultrasound that was done given patient's abdominal discomfort/ nausea in August 2016.  Patient states that approximately 3 years ago patient  was evaluated in the emergency room for GI-nausea vomiting diarrhea which led to a ultrasound that showed a spleen of 14.7 cm. However this can done on the same time did not show any splenomegaly.  Patient in August 2016- had a similar episode with significant nausea vomiting diarrhea; and also diffuse night sweats to point that he was drenched. He had ultrasound- that showed enlarged spleen was 17 cm/the volume of around 700 cc.   currently he denies any  Shortness of breath or chest pain. Denies any diarrhea or abdominal pain.  He does have   Dragging sensation in the left upper quadrant. Posture for early satiety.  He denies any significant weight loss.  He does continue to have   Drenching/ significant night sweats approximately twice a month.  ROS: A complete 10 point review of system is done which is negative except mentioned above in history of present illness  Social history:  No smoking or alcohol. Patient  Lives with his wife.  He works  In Field seismologist.  MEDICAL HISTORY:  Past Medical History  Diagnosis Date  . Renal disorder   . Splenomegaly   . Asthma   . Hematuria   . HLD (hyperlipidemia)   . RUQ pain   . Anxiety   . Restless leg   . Sleep apnea   . Vertigo, benign positional   . Depression   . Post-nasal drainage   . Airway hyperreactivity   . Hypertriglyceridemia   . Kidney stone on right side   . Microscopic hematuria   . Right ureteral stone   . Obesity   . HTN (hypertension)     SURGICAL HISTORY: Past  Surgical History  Procedure Laterality Date  . Sinus surgery with instatrak  1990  . Lithotripsy    . Ganglion cyst removal  2006  . Tonsillectomy  1990    SOCIAL HISTORY: Social History   Social History  . Marital Status: Married    Spouse Name: N/A  . Number of Children: N/A  . Years of Education: N/A   Occupational History  . Not on file.   Social History Main Topics  . Smoking status: Never Smoker   . Smokeless tobacco: Not on file  . Alcohol Use: No  . Drug Use: No  . Sexual Activity: Not on file   Other Topics Concern  . Not on file   Social History Narrative    FAMILY HISTORY: Family History  Problem Relation Age of Onset  . Colon cancer Brother   . Colon cancer Maternal Uncle   . Diabetes Mellitus II Father   . Hypertension Daughter     ALLERGIES:  has No Known Allergies.  MEDICATIONS:  Current Outpatient Prescriptions  Medication Sig Dispense Refill  . ALPRAZolam (XANAX) 0.5 MG tablet Take 0.5 mg by mouth 3 (three) times daily as needed for anxiety.    . clonazePAM (KLONOPIN) 0.5 MG tablet Take by mouth.    Marland Kitchen FLUTICASONE PROPIONATE HFA IN Place into the nose.    Marland Kitchen Fluticasone-Salmeterol (ADVAIR DISKUS) 250-50  MCG/DOSE AEPB Inhale into the lungs.    Marland Kitchen levalbuterol (XOPENEX HFA) 45 MCG/ACT inhaler Inhale into the lungs.    . ondansetron (ZOFRAN) 4 MG tablet Take 1 tablet (4 mg total) by mouth every 8 (eight) hours as needed for nausea or vomiting. 30 tablet 0  . PARoxetine (PAXIL) 20 MG tablet One Tab twice a day by mouth (Patient taking differently: Take 20 mg by mouth 2 (two) times daily. ) 60 tablet 3  . tamsulosin (FLOMAX) 0.4 MG CAPS capsule Take 0.4 mg by mouth.     No current facility-administered medications for this visit.      Marland Kitchen  PHYSICAL EXAMINATION: ECOG PERFORMANCE STATUS: 1 - Symptomatic but completely ambulatory  There were no vitals filed for this visit. There were no vitals filed for this visit.  GENERAL: Well-nourished  well-developed; Alert, no distress and comfortable.   Accompanied by his wife. EYES: no pallor or icterus OROPHARYNX: no thrush or ulceration; good dentition  NECK: supple, no masses felt LYMPH:  no palpable lymphadenopathy in the cervical, axillary or inguinal regions LUNGS: clear to auscultation and  No wheeze or crackles HEART/CVS: regular rate & rhythm and no murmurs; No lower extremity edema ABDOMEN: abdomen soft, non-tender and normal bowel sounds; 4 fingers breath below the left costal margin. Musculoskeletal:no cyanosis of digits and no clubbing  PSYCH: alert & oriented x 3 with fluent speech NEURO: no focal motor/sensory deficits SKIN:  no rashes or significant lesions  LABORATORY DATA:  I have reviewed the data as listed Lab Results  Component Value Date   WBC 5.4 12/28/2014   HGB 15.7 04/22/2012   HCT 40.1 12/28/2014   MCV 82 04/22/2012   PLT 222 04/22/2012    Recent Labs  12/28/14 1017  NA 141  K 5.4*  CL 101  CO2 24  GLUCOSE 105*  BUN 13  CREATININE 1.01  CALCIUM 9.2  GFRNONAA 89  GFRAA 103  PROT 6.4  AST 23  ALT 41  ALKPHOS 62  BILITOT 0.5    RADIOGRAPHIC STUDIES: I have personally reviewed the radiological images as listed and agreed with the findings in the report. No results found.  ASSESSMENT & PLAN:   # Splenomegaly and night sweats- concerning for lymphoma.  Patient's CT scan- April 2016 did not show any lymphadenopathy.  However sometimes lymphoma can this involving the spleen- splenic marginal zone lymphoma etc.  My concerns for  Other lymphoproliferative neoplasm  Is low given  Blood counts are normal.    I would recommend a PET scan for further evaluation.   Recommend LDH; immunoglobulin with fixation;  Also recommend hepatitis C antibody workup.  All questions were answered. The patient knows to call the clinic with any problems, questions or concerns.   I spent 30 minutes counseling the patient face to face. The total time spent in  the appointment was 60 minutes and more than 50% was on counseling.     Cammie Sickle, MD 01/29/2015 3:46 PM

## 2015-02-01 LAB — IMMUNOFIXATION ELECTROPHORESIS
IGM, SERUM: 36 mg/dL (ref 20–172)
IgA: 115 mg/dL (ref 90–386)
IgG (Immunoglobin G), Serum: 942 mg/dL (ref 700–1600)
TOTAL PROTEIN ELP: 6.9 g/dL (ref 6.0–8.5)

## 2015-02-02 ENCOUNTER — Other Ambulatory Visit: Payer: Self-pay | Admitting: Internal Medicine

## 2015-02-02 DIAGNOSIS — C8207 Follicular lymphoma grade I, spleen: Secondary | ICD-10-CM

## 2015-02-04 ENCOUNTER — Ambulatory Visit: Payer: No Typology Code available for payment source

## 2015-02-05 ENCOUNTER — Ambulatory Visit
Admission: RE | Admit: 2015-02-05 | Discharge: 2015-02-05 | Disposition: A | Discharge status: Home / Self Care | Payer: No Typology Code available for payment source | Source: Ambulatory Visit | Attending: Internal Medicine | Admitting: Internal Medicine

## 2015-02-05 DIAGNOSIS — N281 Cyst of kidney, acquired: Secondary | ICD-10-CM | POA: Insufficient documentation

## 2015-02-05 DIAGNOSIS — N200 Calculus of kidney: Secondary | ICD-10-CM | POA: Diagnosis not present

## 2015-02-05 DIAGNOSIS — R918 Other nonspecific abnormal finding of lung field: Secondary | ICD-10-CM | POA: Insufficient documentation

## 2015-02-05 DIAGNOSIS — R161 Splenomegaly, not elsewhere classified: Secondary | ICD-10-CM | POA: Insufficient documentation

## 2015-02-05 DIAGNOSIS — C8207 Follicular lymphoma grade I, spleen: Secondary | ICD-10-CM

## 2015-02-05 MED ORDER — IOHEXOL 350 MG/ML SOLN
100.0000 mL | Freq: Once | INTRAVENOUS | Status: AC | PRN
  Administered 2015-02-05: 100 mL via INTRAVENOUS

## 2015-02-08 ENCOUNTER — Encounter: Payer: Self-pay | Admitting: Internal Medicine

## 2015-02-08 ENCOUNTER — Inpatient Hospital Stay: Payer: No Typology Code available for payment source | Attending: Internal Medicine | Admitting: Internal Medicine

## 2015-02-08 ENCOUNTER — Encounter (INDEPENDENT_AMBULATORY_CARE_PROVIDER_SITE_OTHER): Payer: Self-pay

## 2015-02-08 ENCOUNTER — Ambulatory Visit: Payer: No Typology Code available for payment source | Admitting: Internal Medicine

## 2015-02-08 VITALS — BP 123/84 | HR 73 | Temp 97.8°F | Resp 18 | Ht 73.0 in | Wt 250.4 lb

## 2015-02-08 DIAGNOSIS — G4730 Sleep apnea, unspecified: Secondary | ICD-10-CM | POA: Diagnosis not present

## 2015-02-08 DIAGNOSIS — F329 Major depressive disorder, single episode, unspecified: Secondary | ICD-10-CM | POA: Insufficient documentation

## 2015-02-08 DIAGNOSIS — Z87442 Personal history of urinary calculi: Secondary | ICD-10-CM | POA: Insufficient documentation

## 2015-02-08 DIAGNOSIS — E669 Obesity, unspecified: Secondary | ICD-10-CM | POA: Insufficient documentation

## 2015-02-08 DIAGNOSIS — F419 Anxiety disorder, unspecified: Secondary | ICD-10-CM | POA: Insufficient documentation

## 2015-02-08 DIAGNOSIS — E781 Pure hyperglyceridemia: Secondary | ICD-10-CM | POA: Insufficient documentation

## 2015-02-08 DIAGNOSIS — R161 Splenomegaly, not elsewhere classified: Secondary | ICD-10-CM | POA: Diagnosis not present

## 2015-02-08 DIAGNOSIS — G2581 Restless legs syndrome: Secondary | ICD-10-CM | POA: Insufficient documentation

## 2015-02-08 DIAGNOSIS — Z79899 Other long term (current) drug therapy: Secondary | ICD-10-CM | POA: Insufficient documentation

## 2015-02-08 DIAGNOSIS — J45909 Unspecified asthma, uncomplicated: Secondary | ICD-10-CM | POA: Insufficient documentation

## 2015-02-08 DIAGNOSIS — R61 Generalized hyperhidrosis: Secondary | ICD-10-CM | POA: Diagnosis not present

## 2015-02-08 NOTE — Progress Notes (Signed)
[place] NOTE  Patient Care Team: Roselee Nova, MD as PCP - General (Family Medicine) Roselee Nova, MD (Family Medicine)  CHIEF COMPLAINTS/PURPOSE OF CONSULTATION:  Splenomegaly.  # 2013- MILD to MOD SPLENOMEGALY/ Intermittent night sweats-  HISTORY OF PRESENTING ILLNESS:  Kenneth Moreno 44 y.o. male noted to have a splenomegaly on ultrasound in August 2016. She is here to review the results of further workup including CT scans.  He states his night sweats are improving. He had just a few episodes in the last few weeks. His night sweats was not intense. No fevers; no weight loss.  ROS: A complete 10 point review of system is done which is negative except mentioned above in history of present illness  Social history:  No smoking or alcohol. Patient  Lives with his wife.  He works  In Field seismologist.  MEDICAL HISTORY:  Past Medical History  Diagnosis Date  . Renal disorder   . Splenomegaly   . Asthma   . Hematuria   . HLD (hyperlipidemia)   . RUQ pain   . Anxiety   . Restless leg   . Sleep apnea   . Vertigo, benign positional   . Depression   . Post-nasal drainage   . Airway hyperreactivity   . Hypertriglyceridemia   . Kidney stone on right side   . Microscopic hematuria   . Right ureteral stone   . Obesity     SURGICAL HISTORY: Past Surgical History  Procedure Laterality Date  . Sinus surgery with instatrak  1990  . Lithotripsy    . Ganglion cyst removal  2006  . Tonsillectomy  1990    SOCIAL HISTORY: Social History   Social History  . Marital Status: Married    Spouse Name: N/A  . Number of Children: N/A  . Years of Education: N/A   Occupational History  . Not on file.   Social History Main Topics  . Smoking status: Never Smoker   . Smokeless tobacco: Not on file  . Alcohol Use: No  . Drug Use: No  . Sexual Activity: Not on file   Other Topics Concern  . Not on file   Social History Narrative     FAMILY HISTORY: Family History  Problem Relation Age of Onset  . Colon cancer Brother   . Colon cancer Maternal Uncle   . Diabetes Mellitus II Father   . Hypertension Daughter     ALLERGIES:  has No Known Allergies.  MEDICATIONS:  Current Outpatient Prescriptions  Medication Sig Dispense Refill  . ALPRAZolam (XANAX) 0.5 MG tablet Take 0.5 mg by mouth 3 (three) times daily as needed for anxiety.    . clonazePAM (KLONOPIN) 0.5 MG tablet Take by mouth.    Marland Kitchen FLUTICASONE PROPIONATE HFA IN Place into the nose.    Marland Kitchen Fluticasone-Salmeterol (ADVAIR DISKUS) 250-50 MCG/DOSE AEPB Inhale into the lungs.    Marland Kitchen levalbuterol (XOPENEX HFA) 45 MCG/ACT inhaler Inhale into the lungs.    . ondansetron (ZOFRAN) 4 MG tablet Take 1 tablet (4 mg total) by mouth every 8 (eight) hours as needed for nausea or vomiting. 30 tablet 0  . PARoxetine (PAXIL) 20 MG tablet One Tab twice a day by mouth (Patient taking differently: Take 20 mg by mouth 2 (two) times daily. ) 60 tablet 3  . tamsulosin (FLOMAX) 0.4 MG CAPS capsule Take 0.4 mg by mouth.     No current facility-administered medications for this visit.      Marland Kitchen  PHYSICAL EXAMINATION: ECOG PERFORMANCE STATUS: 1 - Symptomatic but completely ambulatory  Filed Vitals:   02/08/15 1353  BP: 123/84  Pulse: 73  Temp: 97.8 F (36.6 C)  Resp: 18   Filed Weights   02/08/15 1353  Weight: 250 lb 7.1 oz (113.601 kg)    GENERAL: Well-nourished well-developed; Alert, no distress and comfortable.   He is alone.  LABORATORY DATA:  I have reviewed the data as listed Lab Results  Component Value Date   WBC 5.4 12/28/2014   HGB 15.7 04/22/2012   HCT 40.1 12/28/2014   MCV 82 04/22/2012   PLT 222 04/22/2012    Recent Labs  12/28/14 1017  NA 141  K 5.4*  CL 101  CO2 24  GLUCOSE 105*  BUN 13  CREATININE 1.01  CALCIUM 9.2  GFRNONAA 89  GFRAA 103  PROT 6.4  ALBUMIN 4.3  AST 23  ALT 41  ALKPHOS 62  BILITOT 0.5    RADIOGRAPHIC STUDIES: I  have personally reviewed the radiological images as listed and agreed with the findings in the report. Ct Chest W Contrast  02/05/2015   CLINICAL DATA:  Night sweats for 3 years with fullness and vomiting. Splenomegaly. No history of malignancy or prior relevant surgery.  EXAM: CT CHEST, ABDOMEN, AND PELVIS WITH CONTRAST  TECHNIQUE: Multidetector CT imaging of the chest, abdomen and pelvis was performed following the standard protocol during bolus administration of intravenous contrast.  CONTRAST:  168mL OMNIPAQUE IOHEXOL 350 MG/ML SOLN  COMPARISON:  Abdominal ultrasound 12/24/2014. Abdominal pelvic CT 08/14/2014 and 04/22/2012.  FINDINGS: CT CHEST FINDINGS  Mediastinum/Nodes: There are no enlarged mediastinal, hilar or axillary lymph nodes. The thyroid gland, trachea and esophagus demonstrate no significant findings. The heart size is normal. There is no pericardial effusion. There are no significant vascular findings.  Lungs/Pleura: There is no pleural effusion.There are scattered small nodules at both lung bases which are stable from 2013, consistent with benign findings. These include a 5 mm subpleural right middle lobe nodule on image 38, small right lower lobe nodules on images 43 and 46, tiny left upper lobe nodule on image 38 and a 5 mm left lower lobe nodule on image 46. No new, enlarging or suspicious nodules demonstrated.  Musculoskeletal/Chest wall: No chest wall mass or suspicious osseous findings.  CT ABDOMEN AND PELVIS FINDINGS  Hepatobiliary: Mildly decreased hepatic density suspicious for steatosis. No focal abnormalities observed. No evidence of gallstones, gallbladder wall thickening or biliary dilatation.  Pancreas: Unremarkable. No pancreatic ductal dilatation or surrounding inflammatory changes.  Spleen: The spleen has mildly enlarged compared with the prior CTs, measuring 13.2 x 5.6 x 14.5 ml for an estimated volume of 536 ml. This does represent some improvement compared with the prior  ultrasound. No focal splenic or perisplenic abnormality identified.  Adrenals/Urinary Tract: Both adrenal glands appear normal. There are stable small cysts in the upper poles of both kidneys. There is a tiny nonobstructing calculus in the lower interpolar region of the left kidney. No evidence of ureteral calculus or hydronephrosis. The bladder appears unremarkable.  Stomach/Bowel: No evidence of bowel wall thickening, distention or surrounding inflammatory change. The appendix appears normal.  Vascular/Lymphatic: There are no enlarged abdominal or pelvic lymph nodes. No significant vascular findings are present. There is a small accessory right renal artery.  Reproductive: Unremarkable.  Other: There is a stable bilobed calcification deep to the umbilicus.  Musculoskeletal: No acute or significant osseous findings. Stable degenerative disc disease at L5-S1 with associated osseous foraminal  narrowing on the left.  IMPRESSION: 1. Mild nonspecific splenomegaly, improved from ultrasound of 6 weeks ago. 2. No adenopathy or suspicious parenchymal findings. 3. Stable small nodules at both lung bases. 4. Bilateral renal cysts and tiny nonobstructing left renal calculus.   Electronically Signed   By: Richardean Sale M.D.   On: 02/05/2015 10:15   Ct Abdomen Pelvis W Contrast  02/05/2015   CLINICAL DATA:  Night sweats for 3 years with fullness and vomiting. Splenomegaly. No history of malignancy or prior relevant surgery.  EXAM: CT CHEST, ABDOMEN, AND PELVIS WITH CONTRAST  TECHNIQUE: Multidetector CT imaging of the chest, abdomen and pelvis was performed following the standard protocol during bolus administration of intravenous contrast.  CONTRAST:  19mL OMNIPAQUE IOHEXOL 350 MG/ML SOLN  COMPARISON:  Abdominal ultrasound 12/24/2014. Abdominal pelvic CT 08/14/2014 and 04/22/2012.  FINDINGS: CT CHEST FINDINGS  Mediastinum/Nodes: There are no enlarged mediastinal, hilar or axillary lymph nodes. The thyroid gland, trachea  and esophagus demonstrate no significant findings. The heart size is normal. There is no pericardial effusion. There are no significant vascular findings.  Lungs/Pleura: There is no pleural effusion.There are scattered small nodules at both lung bases which are stable from 2013, consistent with benign findings. These include a 5 mm subpleural right middle lobe nodule on image 38, small right lower lobe nodules on images 43 and 46, tiny left upper lobe nodule on image 38 and a 5 mm left lower lobe nodule on image 46. No new, enlarging or suspicious nodules demonstrated.  Musculoskeletal/Chest wall: No chest wall mass or suspicious osseous findings.  CT ABDOMEN AND PELVIS FINDINGS  Hepatobiliary: Mildly decreased hepatic density suspicious for steatosis. No focal abnormalities observed. No evidence of gallstones, gallbladder wall thickening or biliary dilatation.  Pancreas: Unremarkable. No pancreatic ductal dilatation or surrounding inflammatory changes.  Spleen: The spleen has mildly enlarged compared with the prior CTs, measuring 13.2 x 5.6 x 14.5 ml for an estimated volume of 536 ml. This does represent some improvement compared with the prior ultrasound. No focal splenic or perisplenic abnormality identified.  Adrenals/Urinary Tract: Both adrenal glands appear normal. There are stable small cysts in the upper poles of both kidneys. There is a tiny nonobstructing calculus in the lower interpolar region of the left kidney. No evidence of ureteral calculus or hydronephrosis. The bladder appears unremarkable.  Stomach/Bowel: No evidence of bowel wall thickening, distention or surrounding inflammatory change. The appendix appears normal.  Vascular/Lymphatic: There are no enlarged abdominal or pelvic lymph nodes. No significant vascular findings are present. There is a small accessory right renal artery.  Reproductive: Unremarkable.  Other: There is a stable bilobed calcification deep to the umbilicus.   Musculoskeletal: No acute or significant osseous findings. Stable degenerative disc disease at L5-S1 with associated osseous foraminal narrowing on the left.  IMPRESSION: 1. Mild nonspecific splenomegaly, improved from ultrasound of 6 weeks ago. 2. No adenopathy or suspicious parenchymal findings. 3. Stable small nodules at both lung bases. 4. Bilateral renal cysts and tiny nonobstructing left renal calculus.   Electronically Signed   By: Richardean Sale M.D.   On: 02/05/2015 10:15    ASSESSMENT & PLAN:   # Splenomegaly and night sweats. CT chest abdomen pelvis did not show any other lymphadenopathy. CT abdomen pelvis-mild splenomegaly/slightly improved from ultrasound that was done 6 weeks prior.   # I had a long discussion the patient regarding the above workup- and the fact that this does not lead [place] to any definite conclusions of a splenomegaly/night  sweats. I would suspect his splenomegaly is probably from viral infection rather than a lymphoma of the spleen at this time. The only but definitely conclude would be splenectomy; which I think is too aggressive at this time. I would recommend a follow-up with an ultrasound in 6 months.  # Night sweats unclear etiology question viral infection this improving.   All questions were answered. The patient knows to call the clinic with any problems, questions or concerns.  I spent 15 minutes counseling the patient face to face. The total time spent in the appointment was 30 minutes and more than 50% was on counseling.     Cammie Sickle, MD 02/08/2015 2:47 PM

## 2015-02-15 ENCOUNTER — Other Ambulatory Visit: Payer: Self-pay | Admitting: Family Medicine

## 2015-02-15 DIAGNOSIS — F329 Major depressive disorder, single episode, unspecified: Secondary | ICD-10-CM

## 2015-02-15 DIAGNOSIS — F32A Depression, unspecified: Secondary | ICD-10-CM

## 2015-02-15 NOTE — Telephone Encounter (Signed)
Pt needs refill on paxal to be sent to [facility]. Pt is completely out of his meds.

## 2015-02-16 MED ORDER — PAROXETINE HCL 20 MG PO TABS: ORAL_TABLET | ORAL | Status: DC

## 2015-02-16 NOTE — Telephone Encounter (Signed)
Routed to Dr. Shah for medication refill approval 

## 2015-04-27 ENCOUNTER — Encounter: Payer: Self-pay | Admitting: Family Medicine

## 2015-04-27 ENCOUNTER — Ambulatory Visit (INDEPENDENT_AMBULATORY_CARE_PROVIDER_SITE_OTHER): Payer: No Typology Code available for payment source | Admitting: Family Medicine

## 2015-04-27 VITALS — BP 120/76 | HR 75 | Temp 98.2°F | Resp 16 | Ht 73.0 in | Wt 259.2 lb

## 2015-04-27 DIAGNOSIS — F411 Generalized anxiety disorder: Secondary | ICD-10-CM

## 2015-04-27 DIAGNOSIS — E785 Hyperlipidemia, unspecified: Secondary | ICD-10-CM

## 2015-04-27 MED ORDER — CLONAZEPAM 0.5 MG PO TABS: 0.5000 mg | ORAL_TABLET | Freq: Two times a day (BID) | ORAL | Status: DC | PRN

## 2015-04-27 MED ORDER — PAROXETINE HCL 20 MG PO TABS: ORAL_TABLET | ORAL | Status: DC

## 2015-04-27 NOTE — Progress Notes (Signed)
Name: Kenneth Moreno   MRN: [id number]    DOB: Nov 15, 1970   Date:04/27/2015       Progress Note  Subjective  Chief Complaint  Chief Complaint  Patient presents with  . Follow-up    3 mo  . Hyperlipidemia  . Asthma  . Anxiety  . Medication Refill    colnazepam 0.5mg  / paroxetine 20mg  / alprazolam 1 mg    Hyperlipidemia This is a chronic problem. The problem is controlled. Recent lipid tests were reviewed and are high (Elevated TG). Pertinent negatives include no chest pain. Treatments tried: taking Fish oil as needed.  Anxiety Presents for follow-up visit. The problem has been unchanged. Symptoms include excessive worry, nervous/anxious behavior and panic. Patient reports no chest pain, depressed mood, insomnia, irritability, malaise or palpitations. The severity of symptoms is moderate.   His past medical history is significant for anxiety/panic attacks. There is no history of depression. Past treatments include benzodiazephines and SSRIs. The treatment provided significant relief. Compliance with prior treatments has been good.    Past Medical History  Diagnosis Date  . Renal disorder   . Splenomegaly   . Asthma   . Hematuria   . HLD (hyperlipidemia)   . RUQ pain   . Anxiety   . Restless leg   . Sleep apnea   . Vertigo, benign positional   . Depression   . Post-nasal drainage   . Airway hyperreactivity   . Hypertriglyceridemia   . Kidney stone on right side   . Microscopic hematuria   . Right ureteral stone   . Obesity     Past Surgical History  Procedure Laterality Date  . Sinus surgery with instatrak  1990  . Lithotripsy    . Ganglion cyst removal  2006  . Tonsillectomy  1990    Family History  Problem Relation Age of Onset  . Colon cancer Brother   . Colon cancer Maternal Uncle   . Diabetes Mellitus II Father   . Hypertension Daughter     Social History   Social History  . Marital Status: Married    Spouse Name: N/A  . Number of Children: N/A   . Years of Education: N/A   Occupational History  . Not on file.   Social History Main Topics  . Smoking status: Never Smoker   . Smokeless tobacco: Not on file  . Alcohol Use: No  . Drug Use: No  . Sexual Activity: Not on file   Other Topics Concern  . Not on file   Social History Narrative    Current outpatient prescriptions:  .  clonazePAM (KLONOPIN) 0.5 MG tablet, Take 1 tablet (0.5 mg total) by mouth 2 (two) times daily as needed for anxiety., Disp: 60 tablet, Rfl: 0 .  FLUTICASONE PROPIONATE HFA IN, Place into the nose., Disp: , Rfl:  .  Fluticasone-Salmeterol (ADVAIR DISKUS) 250-50 MCG/DOSE AEPB, Inhale into the lungs., Disp: , Rfl:  .  levalbuterol (XOPENEX HFA) 45 MCG/ACT inhaler, Inhale into the lungs., Disp: , Rfl:  .  ondansetron (ZOFRAN) 4 MG tablet, Take 1 tablet (4 mg total) by mouth every 8 (eight) hours as needed for nausea or vomiting., Disp: 30 tablet, Rfl: 0 .  PARoxetine (PAXIL) 20 MG tablet, One Tab twice a day by mouth, Disp: 60 tablet, Rfl: 2 .  tamsulosin (FLOMAX) 0.4 MG CAPS capsule, Take 0.4 mg by mouth., Disp: , Rfl:   No Known Allergies   Review of Systems  Constitutional: Negative for irritability.  Cardiovascular: Negative for chest pain and palpitations.  Psychiatric/Behavioral: The patient is nervous/anxious. The patient does not have insomnia.     Objective  Filed Vitals:   04/27/15 0840  BP: 120/76  Pulse: 75  Temp: 98.2 F (36.8 C)  TempSrc: Oral  Resp: 16  Height: 6\' 1"  (1.854 m)  Weight: 259 lb 3.2 oz (117.572 kg)  SpO2: 97%    Physical Exam  Constitutional: He is oriented to person, place, and time and well-developed, well-nourished, and in no distress.  Cardiovascular: Normal rate and regular rhythm.   Pulmonary/Chest: Effort normal and breath sounds normal.  Abdominal: Soft. Bowel sounds are normal.  Neurological: He is alert and oriented to person, place, and time.  Psychiatric: Mood, memory, affect and judgment  normal.  Nursing note and vitals reviewed.   Assessment & Plan  1. Anxiety, generalized  - PARoxetine (PAXIL) 20 MG tablet; One Tab twice a day by mouth  Dispense: 60 tablet; Refill: 2 - clonazePAM (KLONOPIN) 0.5 MG tablet; Take 1 tablet (0.5 mg total) by mouth 2 (two) times daily as needed for anxiety.  Dispense: 60 tablet; Refill: 0  2. Dyslipidemia   - Lipid Profile - Comprehensive Metabolic Panel (CMET)   Ski Polich [facility] 04/27/2015 7:36 PM

## 2015-04-28 LAB — COMPREHENSIVE METABOLIC PANEL
ALT: 41 IU/L (ref 0–44)
AST: 19 IU/L (ref 0–40)
Albumin/Globulin Ratio: 2 (ref 1.1–2.5)
Albumin: 4.6 g/dL (ref 3.5–5.5)
Alkaline Phosphatase: 50 IU/L (ref 39–117)
BUN/Creatinine Ratio: 14 (ref 9–20)
BUN: 14 mg/dL (ref 6–24)
Bilirubin Total: 0.6 mg/dL (ref 0.0–1.2)
CALCIUM: 9.5 mg/dL (ref 8.7–10.2)
CO2: 26 mmol/L (ref 18–29)
CREATININE: 0.97 mg/dL (ref 0.76–1.27)
Chloride: 100 mmol/L (ref 96–106)
GFR calc Af Amer: 108 mL/min/{1.73_m2} (ref >59)
GFR, EST NON AFRICAN AMERICAN: 93 mL/min/{1.73_m2} (ref >59)
GLOBULIN, TOTAL: 2.3 g/dL (ref 1.5–4.5)
GLUCOSE: 103 mg/dL — AB (ref 65–99)
Potassium: 4.7 mmol/L (ref 3.5–5.2)
SODIUM: 141 mmol/L (ref 134–144)
Total Protein: 6.9 g/dL (ref 6.0–8.5)

## 2015-04-28 LAB — LIPID PANEL
CHOLESTEROL TOTAL: 184 mg/dL (ref 100–199)
Chol/HDL Ratio: 6.3 ratio units — ABNORMAL HIGH (ref 0.0–5.0)
HDL: 29 mg/dL — ABNORMAL LOW (ref >39)
Triglycerides: 500 mg/dL — ABNORMAL HIGH (ref 0–149)

## 2015-05-10 ENCOUNTER — Other Ambulatory Visit: Payer: Self-pay | Admitting: Family Medicine

## 2015-07-22 ENCOUNTER — Encounter: Payer: Self-pay | Admitting: Family Medicine

## 2015-07-26 ENCOUNTER — Ambulatory Visit (INDEPENDENT_AMBULATORY_CARE_PROVIDER_SITE_OTHER): Payer: No Typology Code available for payment source | Admitting: Family Medicine

## 2015-07-26 ENCOUNTER — Encounter: Payer: Self-pay | Admitting: Family Medicine

## 2015-07-26 VITALS — BP 123/70 | HR 69 | Temp 98.1°F | Resp 15 | Ht 73.0 in | Wt 255.8 lb

## 2015-07-26 DIAGNOSIS — E781 Pure hyperglyceridemia: Secondary | ICD-10-CM | POA: Insufficient documentation

## 2015-07-26 NOTE — Progress Notes (Signed)
Name: Kenneth Moreno   MRN: [id number]    DOB: January 17, 1971   Date:07/26/2015       Progress Note  Subjective  Chief Complaint  Chief Complaint  Patient presents with  . Follow-up    3 mo  . Hyperlipidemia    HPI  Hyperlipidemia: Elevated Triglycerides at last visit in December 2016, normal total cholesterol. LDL was not able to be calculated. He is not on any pharmacotherapy for elevated triglycerides  Past Medical History  Diagnosis Date  . Renal disorder   . Splenomegaly   . Asthma   . Hematuria   . HLD (hyperlipidemia)   . RUQ pain   . Anxiety   . Restless leg   . Sleep apnea   . Vertigo, benign positional   . Depression   . Post-nasal drainage   . Airway hyperreactivity   . Hypertriglyceridemia   . Kidney stone on right side   . Microscopic hematuria   . Right ureteral stone   . Obesity     Past Surgical History  Procedure Laterality Date  . Sinus surgery with instatrak  1990  . Lithotripsy    . Ganglion cyst removal  2006  . Tonsillectomy  1990    Family History  Problem Relation Age of Onset  . Colon cancer Brother   . Colon cancer Maternal Uncle   . Diabetes Mellitus II Father   . Hypertension Daughter     Social History   Social History  . Marital Status: Married    Spouse Name: N/A  . Number of Children: N/A  . Years of Education: N/A   Occupational History  . Not on file.   Social History Main Topics  . Smoking status: Never Smoker   . Smokeless tobacco: Not on file  . Alcohol Use: No  . Drug Use: No  . Sexual Activity: Not on file   Other Topics Concern  . Not on file   Social History Narrative     Current outpatient prescriptions:  .  clonazePAM (KLONOPIN) 0.5 MG tablet, Take 1 tablet (0.5 mg total) by mouth 2 (two) times daily as needed for anxiety., Disp: 60 tablet, Rfl: 0 .  FLUTICASONE PROPIONATE HFA IN, Place into the nose., Disp: , Rfl:  .  Fluticasone-Salmeterol (ADVAIR DISKUS) 250-50 MCG/DOSE AEPB, Inhale into the  lungs., Disp: , Rfl:  .  levalbuterol (XOPENEX HFA) 45 MCG/ACT inhaler, Inhale into the lungs., Disp: , Rfl:  .  ondansetron (ZOFRAN) 4 MG tablet, Take 1 tablet (4 mg total) by mouth every 8 (eight) hours as needed for nausea or vomiting., Disp: 30 tablet, Rfl: 0 .  PARoxetine (PAXIL) 20 MG tablet, One Tab twice a day by mouth, Disp: 60 tablet, Rfl: 2 .  PARoxetine (PAXIL) 20 MG tablet, TAKE ONE TABLET BY MOUTH TWICE A DAY BY MOUTH, Disp: 60 tablet, Rfl: 2 .  tamsulosin (FLOMAX) 0.4 MG CAPS capsule, Take 0.4 mg by mouth., Disp: , Rfl:   No Known Allergies   Review of Systems  Respiratory: Negative for shortness of breath.   Cardiovascular: Negative for chest pain.  Gastrointestinal: Negative for nausea, vomiting and abdominal pain.     Objective  Filed Vitals:   07/26/15 0854  BP: 123/70  Pulse: 69  Temp: 98.1 F (36.7 C)  TempSrc: Oral  Resp: 15  Height: 6\' 1"  (1.854 m)  Weight: 255 lb 12.8 oz (116.03 kg)  SpO2: 96%    Physical Exam  Constitutional: He is oriented to  person, place, and time and well-developed, well-nourished, and in no distress.  Cardiovascular: Normal rate and regular rhythm.   Pulmonary/Chest: Effort normal and breath sounds normal.  Abdominal: Soft. Bowel sounds are normal.  Neurological: He is alert and oriented to person, place, and time.  Nursing note and vitals reviewed.     Recent Results (from the past 2160 hour(s))  Lipid Profile     Status: Abnormal   Collection Time: 04/27/15  9:35 AM  Result Value Ref Range   Cholesterol, Total 184 100 - 199 mg/dL   Triglycerides 500 (H) 0 - 149 mg/dL   HDL 29 (L) >39 mg/dL   VLDL Cholesterol Cal Comment 5 - 40 mg/dL    Comment: The calculation for the VLDL cholesterol is not valid when triglyceride level is >400 mg/dL.    LDL Calculated Comment 0 - 99 mg/dL    Comment: Triglyceride result indicated is too high for an accurate LDL cholesterol estimation.    Chol/HDL Ratio 6.3 (H) 0.0 - 5.0 ratio  units    Comment:                                   T. Chol/HDL Ratio                                             Men  Women                               1/2 Avg.Risk  3.4    3.3                                   Avg.Risk  5.0    4.4                                2X Avg.Risk  9.6    7.1                                3X Avg.Risk 23.4   11.0   Comprehensive Metabolic Panel (CMET)     Status: Abnormal   Collection Time: 04/27/15  9:35 AM  Result Value Ref Range   Glucose 103 (H) 65 - 99 mg/dL   BUN 14 6 - 24 mg/dL   Creatinine, Ser 0.97 0.76 - 1.27 mg/dL   GFR calc non Af Amer 93 >59 mL/min/1.73   GFR calc Af Amer 108 >59 mL/min/1.73   BUN/Creatinine Ratio 14 9 - 20   Sodium 141 134 - 144 mmol/L   Potassium 4.7 3.5 - 5.2 mmol/L   Chloride 100 96 - 106 mmol/L   CO2 26 18 - 29 mmol/L   Calcium 9.5 8.7 - 10.2 mg/dL   Total Protein 6.9 6.0 - 8.5 g/dL   Albumin 4.6 3.5 - 5.5 g/dL   Globulin, Total 2.3 1.5 - 4.5 g/dL   Albumin/Globulin Ratio 2.0 1.1 - 2.5   Bilirubin Total 0.6 0.0 - 1.2 mg/dL   Alkaline Phosphatase 50 39 - 117 IU/L   AST 19 0 - 40 IU/L  ALT 41 0 - 44 IU/L     Assessment & Plan  1. Hypertriglyceridemia Recheck fasting lipid panel and consider starting on Vascepa for treatment of hypertriglyceridemia. - Lipid Profile   Geraldyne Barraclough [facility] 07/26/2015 9:02 AM

## 2015-07-27 LAB — LIPID PANEL
CHOL/HDL RATIO: 6.3 ratio — AB (ref 0.0–5.0)
CHOLESTEROL TOTAL: 170 mg/dL (ref 100–199)
HDL: 27 mg/dL — AB (ref >39)
TRIGLYCERIDES: 520 mg/dL — AB (ref 0–149)

## 2015-07-29 ENCOUNTER — Telehealth: Payer: Self-pay

## 2015-07-29 MED ORDER — ICOSAPENT ETHYL 1 G PO CAPS: 2.0000 g | ORAL_CAPSULE | Freq: Two times a day (BID) | ORAL | Status: DC

## 2015-07-29 NOTE — Telephone Encounter (Signed)
Lab results have been reported to patient and a prescription for Vascepa 2 g (#360)  [facility] [place] [facility] [address] per Dr. Manuella Ghazi, patient is to take Vascepa 2 g twice daily with meals, patient has been notified

## 2015-08-06 ENCOUNTER — Ambulatory Visit: Payer: No Typology Code available for payment source

## 2015-08-09 ENCOUNTER — Inpatient Hospital Stay: Payer: No Typology Code available for payment source | Admitting: Internal Medicine

## 2015-08-12 ENCOUNTER — Ambulatory Visit
Admission: RE | Admit: 2015-08-12 | Discharge: 2015-08-12 | Disposition: A | Discharge status: Home / Self Care | Payer: 59 | Source: Ambulatory Visit | Attending: Internal Medicine | Admitting: Internal Medicine

## 2015-08-12 DIAGNOSIS — R161 Splenomegaly, not elsewhere classified: Secondary | ICD-10-CM | POA: Diagnosis present

## 2015-08-16 ENCOUNTER — Inpatient Hospital Stay: Payer: PRIVATE HEALTH INSURANCE | Attending: Internal Medicine | Admitting: Internal Medicine

## 2015-08-16 VITALS — BP 138/88 | HR 77 | Temp 97.9°F | Resp 18 | Wt 258.4 lb

## 2015-08-16 DIAGNOSIS — E669 Obesity, unspecified: Secondary | ICD-10-CM | POA: Diagnosis not present

## 2015-08-16 DIAGNOSIS — F329 Major depressive disorder, single episode, unspecified: Secondary | ICD-10-CM | POA: Diagnosis not present

## 2015-08-16 DIAGNOSIS — G4730 Sleep apnea, unspecified: Secondary | ICD-10-CM | POA: Insufficient documentation

## 2015-08-16 DIAGNOSIS — Z79899 Other long term (current) drug therapy: Secondary | ICD-10-CM | POA: Diagnosis not present

## 2015-08-16 DIAGNOSIS — J45909 Unspecified asthma, uncomplicated: Secondary | ICD-10-CM | POA: Diagnosis not present

## 2015-08-16 DIAGNOSIS — Z87442 Personal history of urinary calculi: Secondary | ICD-10-CM | POA: Diagnosis not present

## 2015-08-16 DIAGNOSIS — G2581 Restless legs syndrome: Secondary | ICD-10-CM | POA: Insufficient documentation

## 2015-08-16 DIAGNOSIS — E781 Pure hyperglyceridemia: Secondary | ICD-10-CM | POA: Insufficient documentation

## 2015-08-16 DIAGNOSIS — F419 Anxiety disorder, unspecified: Secondary | ICD-10-CM | POA: Diagnosis not present

## 2015-08-16 DIAGNOSIS — R161 Splenomegaly, not elsewhere classified: Secondary | ICD-10-CM | POA: Diagnosis present

## 2015-08-16 NOTE — Progress Notes (Signed)
Patient here for US results. 

## 2015-08-16 NOTE — Progress Notes (Signed)
[place] NOTE  Patient Care Team: Roselee Nova, MD as PCP - General (Family Medicine) Roselee Nova, MD (Family Medicine)  CHIEF COMPLAINTS/PURPOSE OF CONSULTATION:  Splenomegaly.  # Aug 2016- MILD to MOD SPLENOMEGALY; Oct CT- no LAD;  April 2017- Stable/Improved.  HISTORY OF PRESENTING ILLNESS:  Kenneth Moreno 45 y.o. male noted to have a splenomegaly on ultrasound in August 2016;  Patient is here for follow-up/  Review the follow-up ultrasound.  Patient's appetite is good. He denies any weight loss.  No night sweats. No early satiety.  ROS: A complete 10 point review of system is done which is negative except mentioned above in history of present illness  Social history:  No smoking or alcohol. Patient  Lives with his wife.  He works  In Field seismologist.  MEDICAL HISTORY:  Past Medical History  Diagnosis Date  . Renal disorder   . Splenomegaly   . Asthma   . Hematuria   . HLD (hyperlipidemia)   . RUQ pain   . Anxiety   . Restless leg   . Sleep apnea   . Vertigo, benign positional   . Depression   . Post-nasal drainage   . Airway hyperreactivity   . Hypertriglyceridemia   . Kidney stone on right side   . Microscopic hematuria   . Right ureteral stone   . Obesity     SURGICAL HISTORY: Past Surgical History  Procedure Laterality Date  . Sinus surgery with instatrak  1990  . Lithotripsy    . Ganglion cyst removal  2006  . Tonsillectomy  1990    SOCIAL HISTORY: Social History   Social History  . Marital Status: Married    Spouse Name: N/A  . Number of Children: N/A  . Years of Education: N/A   Occupational History  . Not on file.   Social History Main Topics  . Smoking status: Never Smoker   . Smokeless tobacco: Not on file  . Alcohol Use: No  . Drug Use: No  . Sexual Activity: Not on file   Other Topics Concern  . Not on file   Social History Narrative    FAMILY HISTORY: Family History  Problem  Relation Age of Onset  . Colon cancer Brother   . Colon cancer Maternal Uncle   . Diabetes Mellitus II Father   . Hypertension Daughter     ALLERGIES:  has No Known Allergies.  MEDICATIONS:  Current Outpatient Prescriptions  Medication Sig Dispense Refill  . clonazePAM (KLONOPIN) 0.5 MG tablet Take 1 tablet (0.5 mg total) by mouth 2 (two) times daily as needed for anxiety. 60 tablet 0  . FLUTICASONE PROPIONATE HFA IN Place into the nose.    Marland Kitchen Fluticasone-Salmeterol (ADVAIR DISKUS) 250-50 MCG/DOSE AEPB Inhale into the lungs.    Vanessa Kick Ethyl 1 g CAPS Take 2 g by mouth 2 (two) times daily with a meal. 360 capsule 0  . levalbuterol (XOPENEX HFA) 45 MCG/ACT inhaler Inhale into the lungs.    . ondansetron (ZOFRAN) 4 MG tablet Take 1 tablet (4 mg total) by mouth every 8 (eight) hours as needed for nausea or vomiting. 30 tablet 0  . PARoxetine (PAXIL) 20 MG tablet One Tab twice a day by mouth 60 tablet 2  . PARoxetine (PAXIL) 20 MG tablet TAKE ONE TABLET BY MOUTH TWICE A DAY BY MOUTH 60 tablet 2  . tamsulosin (FLOMAX) 0.4 MG CAPS capsule Take 0.4 mg by  mouth.     No current facility-administered medications for this visit.      Marland Kitchen  PHYSICAL EXAMINATION: ECOG PERFORMANCE STATUS: 1 - Symptomatic but completely ambulatory  Filed Vitals:   08/16/15 1446  BP: 138/88  Pulse: 77  Temp: 97.9 F (36.6 C)  Resp: 18   Filed Weights   08/16/15 1446  Weight: 258 lb 6.1 oz (117.2 kg)    GENERAL: Well-nourished well-developed; Alert, no distress and comfortable. He is alone.  EYES: no pallor or icterus OROPHARYNX: no thrush or ulceration; good dentition  NECK: supple, no masses felt LYMPH: no palpable lymphadenopathy in the cervical, axillary or inguinal regions LUNGS: clear to auscultation and No wheeze or crackles HEART/CVS: regular rate & rhythm and no murmurs; No lower extremity edema ABDOMEN: abdomen soft, non-tender and normal bowel sounds; 4 fingers breath below the left  costal margin. Musculoskeletal:no cyanosis of digits and no clubbing  PSYCH: alert & oriented x 3 with fluent speech NEURO: no focal motor/sensory deficits SKIN: no rashes or significant lesions  LABORATORY DATA:  I have reviewed the data as listed Lab Results  Component Value Date   WBC 5.4 12/28/2014   HGB 15.7 04/22/2012   HCT 40.1 12/28/2014   MCV 82 12/28/2014   PLT 227 12/28/2014    Recent Labs  12/28/14 1017 04/27/15 0935  NA 141 141  K 5.4* 4.7  CL 101 100  CO2 24 26  GLUCOSE 105* 103*  BUN 13 14  CREATININE 1.01 0.97  CALCIUM 9.2 9.5  GFRNONAA 89 93  GFRAA 103 108  PROT 6.4 6.9  ALBUMIN 4.3 4.6  AST 23 19  ALT 41 41  ALKPHOS 62 50  BILITOT 0.5 0.6    RADIOGRAPHIC STUDIES: I have personally reviewed the radiological images as listed and agreed with the findings in the report. US Abdomen Limited  08/12/2015  CLINICAL DATA:  Splenomegaly. EXAM: LIMITED ABDOMINAL ULTRASOUND COMPARISON:  CT abdomen pelvis 02/05/2016; ultrasound abdomen pelvis 12/24/2014 FINDINGS: The spleen measures 13.1 x 15.3 x 5.9 cm with a splenic volume of 610.6 cc. This is decreased in size from prior ultrasound where it had a volume of 720 cc however slightly increased from recent prior CT were it had a volume of 536 cc. IMPRESSION: Persistent splenomegaly, slightly increased from recent prior CT. Note these minor changes in size may be secondary to differences in measurement technique Electronically Signed   By: Lovey Newcomer M.D.   On: 08/12/2015 09:20    ASSESSMENT & PLAN:   # Splenomegaly-  Unclear etiology.   ULTRASOUND DONE IN April 2017-  STABLE SLIGHTLY DECREASED VOLUME OF THE SPLEEN COMPARED TO  Ultrasound in august 2016.  #  As the patient is asymptomatic i would not recommend any further workup at this time.  Again discussed the potential symptoms that might go with lymphoma/ splenomegaly-  Weight loss profuse night sweats and fevers .  Patient will call if he has any new  symptoms.  #  The patient will follow-up with me in one year with labs/  Limited ultrasound of his spleen.     Cammie Sickle, MD 08/16/2015 3:41 PM

## 2015-09-01 ENCOUNTER — Ambulatory Visit (INDEPENDENT_AMBULATORY_CARE_PROVIDER_SITE_OTHER): Payer: PRIVATE HEALTH INSURANCE | Admitting: Urology

## 2015-09-01 ENCOUNTER — Ambulatory Visit
Admission: RE | Admit: 2015-09-01 | Discharge: 2015-09-01 | Disposition: A | Discharge status: Home / Self Care | Payer: PRIVATE HEALTH INSURANCE | Source: Ambulatory Visit | Attending: Urology | Admitting: Urology

## 2015-09-01 ENCOUNTER — Encounter: Payer: Self-pay | Admitting: Urology

## 2015-09-01 VITALS — BP 136/79 | HR 69 | Ht 73.0 in | Wt 258.2 lb

## 2015-09-01 DIAGNOSIS — N202 Calculus of kidney with calculus of ureter: Secondary | ICD-10-CM | POA: Insufficient documentation

## 2015-09-01 DIAGNOSIS — N200 Calculus of kidney: Secondary | ICD-10-CM | POA: Diagnosis present

## 2015-09-01 HISTORY — DX: Calculus of kidney: N20.0

## 2015-09-01 LAB — URINALYSIS, COMPLETE
BILIRUBIN UA: NEGATIVE
GLUCOSE, UA: NEGATIVE
KETONES UA: NEGATIVE
Leukocytes, UA: NEGATIVE
NITRITE UA: NEGATIVE
PROTEIN UA: NEGATIVE
RBC, UA: NEGATIVE
SPEC GRAV UA: 1.025 (ref 1.005–1.030)
UUROB: 0.2 mg/dL (ref 0.2–1.0)
pH, UA: 6 (ref 5.0–7.5)

## 2015-09-01 LAB — MICROSCOPIC EXAMINATION
Bacteria, UA: NONE SEEN
Epithelial Cells (non renal): NONE SEEN /hpf (ref 0–10)
RBC MICROSCOPIC, UA: NONE SEEN /HPF (ref 0–?)
WBC UA: NONE SEEN /HPF (ref 0–?)

## 2015-09-01 NOTE — Progress Notes (Signed)
09/01/2015 10:59 AM   Kenneth Moreno [birth year] [id number]  Referring provider: Roselee Nova, MD [address] [place], [place] [address]  Chief Complaint  Patient presents with  . Nephrolithiasis    1 year recheck    HPI: Patient is a 45 year old Caucasian male with a history of nephrolithiasis who presents today for a yearly follow up.  He underwent  ESWL on 08/20/14 for right UPJ stone.  He passed all fragments.  The stone composition was 75% calcium oxalate monohydrate, 10% calcium oxalate dihydrate and 2% calcium phosphate carbonate.    He has not had any flank pain, passage of fragments or gross hematuria since his last visit with [place] one year ago.  He has not had any recent fevers, chills, nausea or vomiting.  His UA today is normal.  His KUB taken today did not demonstrate any distinct stones.  I have reviewed the films with the patient.    PMH: Past Medical History  Diagnosis Date  . Renal disorder   . Splenomegaly   . Asthma   . Hematuria   . HLD (hyperlipidemia)   . RUQ pain   . Anxiety   . Restless leg   . Sleep apnea   . Vertigo, benign positional   . Depression   . Post-nasal drainage   . Airway hyperreactivity   . Hypertriglyceridemia   . Kidney stone on right side   . Microscopic hematuria   . Right ureteral stone   . Obesity     Surgical History: Past Surgical History  Procedure Laterality Date  . Sinus surgery with instatrak  1990  . Lithotripsy    . Ganglion cyst removal  2006  . Tonsillectomy  1990    Home Medications:    Medication List       This list is accurate as of: 09/01/15 10:59 AM.  Always use your most recent med list.               ADVAIR DISKUS 250-50 MCG/DOSE Aepb  Generic drug:  Fluticasone-Salmeterol  Inhale into the lungs. Reported on 09/01/2015     clonazePAM 0.5 MG tablet  Commonly known as:  KLONOPIN  Take 1 tablet (0.5 mg total) by mouth 2 (two) times daily as needed for anxiety.     FLUTICASONE PROPIONATE HFA IN  Place into the nose.     Icosapent Ethyl 1 g Caps  Take 2 g by mouth 2 (two) times daily with a meal.     ondansetron 4 MG tablet  Commonly known as:  ZOFRAN  Take 1 tablet (4 mg total) by mouth every 8 (eight) hours as needed for nausea or vomiting.     PARoxetine 20 MG tablet  Commonly known as:  PAXIL  One Tab twice a day by mouth     PARoxetine 20 MG tablet  Commonly known as:  PAXIL  TAKE ONE TABLET BY MOUTH TWICE A DAY BY MOUTH     tamsulosin 0.4 MG Caps capsule  Commonly known as:  FLOMAX  Take 0.4 mg by mouth. Reported on 09/01/2015     XOPENEX HFA 45 MCG/ACT inhaler  Generic drug:  levalbuterol  Inhale into the lungs.        Allergies: No Known Allergies  Family History: Family History  Problem Relation Age of Onset  . Colon cancer Brother   . Colon cancer Maternal Uncle   . Diabetes Mellitus II Father   . Hypertension  Daughter   . Kidney disease Neg Hx   . Prostate cancer Paternal Uncle     Social History:  reports that he has never smoked. He does not have any smokeless tobacco history on file. He reports that he does not drink alcohol or use illicit drugs.  ROS: UROLOGY Frequent Urination?: No Hard to postpone urination?: No Burning/pain with urination?: No Get up at night to urinate?: No Leakage of urine?: No Urine stream starts and stops?: No Trouble starting stream?: No Do you have to strain to urinate?: No Blood in urine?: No Urinary tract infection?: No Sexually transmitted disease?: No Injury to kidneys or bladder?: No Painful intercourse?: No Weak stream?: No Erection problems?: No Penile pain?: No  Gastrointestinal Nausea?: No Vomiting?: No Indigestion/heartburn?: No Diarrhea?: No Constipation?: No  Constitutional Fever: No Night sweats?: No Weight loss?: No Fatigue?: No  Skin Skin rash/lesions?: No Itching?: No  Eyes Blurred vision?: No Double vision?: No  Ears/Nose/Throat Sore  throat?: No Sinus problems?: No  Hematologic/Lymphatic Swollen glands?: No Easy bruising?: No  Cardiovascular Leg swelling?: No Chest pain?: No  Respiratory Cough?: No Shortness of breath?: No  Endocrine Excessive thirst?: No  Musculoskeletal Back pain?: No Joint pain?: No  Neurological Headaches?: No Dizziness?: No  Psychologic Depression?: No Anxiety?: No  Physical Exam: BP 136/79 mmHg  Pulse 69  Ht 6\' 1"  (1.854 m)  Wt 258 lb 3.2 oz (117.119 kg)  BMI 34.07 kg/m2  Constitutional: Well nourished. Alert and oriented, No acute distress. HEENT: [place] AT, moist mucus membranes. Trachea midline, no masses. Cardiovascular: No clubbing, cyanosis, or edema. Respiratory: Normal respiratory effort, no increased work of breathing. GI: Abdomen is soft, non tender, non distended, no abdominal masses. Liver and spleen not palpable.  No hernias appreciated.  Stool sample for occult testing is not indicated.   GU: No CVA tenderness.  No bladder fullness or masses.  Patient with circumcised phallus.  Urethral meatus is patent.  No penile discharge. No penile lesions or rashes. Scrotum without lesions, cysts, rashes and/or edema.  Testicles are located scrotally bilaterally. No masses are appreciated in the testicles. Left and right epididymis are normal. Rectal: Patient with  normal sphincter tone. Anus and perineum without scarring or rashes. No rectal masses are appreciated. Prostate is approximately 50 grams, no nodules are appreciated. Seminal vesicles are normal. Skin: No rashes, bruises or suspicious lesions. Lymph: No cervical or inguinal adenopathy. Neurologic: Grossly intact, no focal deficits, moving all 4 extremities. Psychiatric: Normal mood and affect.  Laboratory Data: Lab Results  Component Value Date   WBC 5.4 12/28/2014   HGB 15.7 04/22/2012   HCT 40.1 12/28/2014   MCV 82 12/28/2014   PLT 227 12/28/2014    Lab Results  Component Value Date   CREATININE 0.97  04/27/2015    Lab Results  Component Value Date   TSH 0.811 12/28/2014       Component Value Date/Time   CHOL 170 07/26/2015 0923   HDL 27* 07/26/2015 0923   CHOLHDL 6.3* 07/26/2015 0923   [place] Comment 07/26/2015 0923    Lab Results  Component Value Date   AST 19 04/27/2015   Lab Results  Component Value Date   ALT 41 04/27/2015    Urinalysis Results for orders placed or performed in visit on 09/01/15  Microscopic Examination  Result Value Ref Range   WBC, UA None seen 0 -  5 /hpf   RBC, UA None seen 0 -  2 /hpf   Epithelial Cells (  non renal) None seen 0 - 10 /hpf   Bacteria, UA None seen None seen/Few  Urinalysis, Complete  Result Value Ref Range   Specific Gravity, UA 1.025 1.005 - 1.030   pH, UA 6.0 5.0 - 7.5   Color, UA Yellow Yellow   Appearance Ur Clear Clear   Leukocytes, UA Negative Negative   Protein, UA Negative Negative/Trace   Glucose, UA Negative Negative   Ketones, UA Negative Negative   RBC, UA Negative Negative   Bilirubin, UA Negative Negative   Urobilinogen, Ur 0.2 0.2 - 1.0 mg/dL   Nitrite, UA Negative Negative   Microscopic Examination See below:     Pertinent Imaging: CLINICAL DATA: Kidney stones. Splenomegaly.  EXAM: ABDOMEN - 1 VIEW  COMPARISON: Ultrasound 08/12/2015 . CT 02/05/2015.  FINDINGS: Soft tissue structures are unremarkable. No bowel distention. Stool noted throughout the colon. Tiny left renal calyceal stones cannot be excluded. No evidence of ureteral stone.  IMPRESSION: 1. No acute abnormality. Stool noted throughout the colon.  2. Tiny left renal calyceal stones cannot be completely excluded. Or evidence of ureteral stone.   Electronically Signed  By: Marcello Moores Register  On: 09/01/2015 10:45  Assessment & Plan:    1. Kidney stones:   Patient with a history of a right UPJ stone.  No stones were visible on KUB.  His UA was clear.  He is not having symptoms of renal colic at this time.   Patient will contact our office if should experience flank pain of gross hematuria.    - Urinalysis, Complete   Return in about 1 year (around 08/31/2016) for UA and KUB.  These notes generated with voice recognition software. I apologize for typographical errors.  Zara Council, [facility] [address] [place], [place] [address] [phone]

## 2015-09-08 ENCOUNTER — Other Ambulatory Visit: Payer: Self-pay | Admitting: Family Medicine

## 2015-10-22 ENCOUNTER — Telehealth: Payer: Self-pay | Admitting: Family Medicine

## 2015-10-22 NOTE — Telephone Encounter (Signed)
Please schedule patient for an appointment to evaluate his symptoms.

## 2015-10-22 NOTE — Telephone Encounter (Signed)
Was seen at [facility] on 10-21-15 for bronchitis which affects his asthma. Nothing was called in was told to get something over the counter and if not better in a few days to come back to see them. He is asking that you please call in Prednisone to [facility]. Symptoms include: chest congestion, hoarseness, cough going on for 3 days.

## 2015-10-25 NOTE — Telephone Encounter (Signed)
Currently out of town and will call back

## 2015-10-29 NOTE — Telephone Encounter (Signed)
Okay thank you

## 2015-12-08 ENCOUNTER — Other Ambulatory Visit: Payer: Self-pay | Admitting: Family Medicine

## 2016-01-28 DIAGNOSIS — M545 Low back pain, unspecified: Secondary | ICD-10-CM | POA: Insufficient documentation

## 2016-03-23 ENCOUNTER — Other Ambulatory Visit: Payer: Self-pay | Admitting: Family Medicine

## 2016-03-31 ENCOUNTER — Telehealth: Payer: Self-pay | Admitting: Family Medicine

## 2016-03-31 MED ORDER — PAROXETINE HCL 20 MG PO TABS: 20.0000 mg | ORAL_TABLET | Freq: Two times a day (BID) | ORAL | 0 refills | Status: DC

## 2016-03-31 NOTE — Telephone Encounter (Signed)
Prescription for Paxil is sent to pharmacy

## 2016-03-31 NOTE — Telephone Encounter (Signed)
PT IS OUT OF HIS PAXIL. HE HAS A APPT WITH YOU ON Monday THE 4TH. COULD YOU PLEASE GO AHEAD AND REFILL HIS MEDICATION FOR HE TOOK THE LAST ONE TODAY AND DOES NOT NEED TO GET THIS OUT OF HIS SYSTEM COLD [place].

## 2016-04-03 ENCOUNTER — Ambulatory Visit (INDEPENDENT_AMBULATORY_CARE_PROVIDER_SITE_OTHER): Payer: PRIVATE HEALTH INSURANCE | Admitting: Family Medicine

## 2016-04-03 ENCOUNTER — Encounter: Payer: Self-pay | Admitting: Family Medicine

## 2016-04-03 VITALS — BP 132/78 | HR 78 | Temp 98.9°F | Resp 16 | Ht 73.0 in | Wt 261.5 lb

## 2016-04-03 DIAGNOSIS — E781 Pure hyperglyceridemia: Secondary | ICD-10-CM

## 2016-04-03 DIAGNOSIS — R739 Hyperglycemia, unspecified: Secondary | ICD-10-CM | POA: Insufficient documentation

## 2016-04-03 DIAGNOSIS — F411 Generalized anxiety disorder: Secondary | ICD-10-CM

## 2016-04-03 LAB — LIPID PANEL
CHOL/HDL RATIO: 7 ratio — AB (ref <5.0)
Cholesterol: 176 mg/dL (ref <200)
HDL: 25 mg/dL — AB (ref >40)
Triglycerides: 712 mg/dL — ABNORMAL HIGH (ref <150)

## 2016-04-03 LAB — COMPLETE METABOLIC PANEL WITH GFR
ALT: 38 U/L (ref 9–46)
AST: 21 U/L (ref 10–40)
Albumin: 4.6 g/dL (ref 3.6–5.1)
Alkaline Phosphatase: 42 U/L (ref 40–115)
BUN: 15 mg/dL (ref 7–25)
CO2: 29 mmol/L (ref 20–31)
Calcium: 9.7 mg/dL (ref 8.6–10.3)
Chloride: 102 mmol/L (ref 98–110)
Creat: 0.95 mg/dL (ref 0.60–1.35)
GFR, Est African American: >89 mL/min (ref >=60)
GLUCOSE: 88 mg/dL (ref 65–99)
POTASSIUM: 4.5 mmol/L (ref 3.5–5.3)
SODIUM: 139 mmol/L (ref 135–146)
Total Bilirubin: 0.5 mg/dL (ref 0.2–1.2)
Total Protein: 6.8 g/dL (ref 6.1–8.1)

## 2016-04-03 MED ORDER — PAROXETINE HCL 20 MG PO TABS: 20.0000 mg | ORAL_TABLET | Freq: Two times a day (BID) | ORAL | 1 refills | Status: DC

## 2016-04-03 MED ORDER — CLONAZEPAM 0.5 MG PO TABS: 0.5000 mg | ORAL_TABLET | Freq: Two times a day (BID) | ORAL | 2 refills | Status: DC | PRN

## 2016-04-03 NOTE — Progress Notes (Signed)
Name: Kenneth Moreno   MRN: [id number]    DOB: 27-Oct-1970   Date:04/04/2016       Progress Note  Subjective  Chief Complaint  Chief Complaint  Patient presents with  . Medication Refill    paxil / clonazepam     Anxiety  Presents for initial visit. Patient reports no depressed mood, excessive worry, malaise, nervous/anxious behavior or panic. The severity of symptoms is moderate.   Past treatments include benzodiazephines and SSRIs. The treatment provided significant relief. Compliance with prior treatments has been good.  Hyperlipidemia  This is a chronic problem. The problem is uncontrolled. Recent lipid tests were reviewed and are high (elevated triglycerides). Treatments tried: Vascepa. Risk factors for coronary artery disease include dyslipidemia.     Past Medical History:  Diagnosis Date  . Airway hyperreactivity   . Anxiety   . Asthma   . Depression   . Hematuria   . HLD (hyperlipidemia)   . Hypertriglyceridemia   . Kidney stone on right side   . Microscopic hematuria   . Obesity   . Post-nasal drainage   . Renal disorder   . Restless leg   . Right ureteral stone   . RUQ pain   . Sleep apnea   . Splenomegaly   . Vertigo, benign positional     Past Surgical History:  Procedure Laterality Date  . ganglion cyst removal  2006  . LITHOTRIPSY    . SINUS SURGERY WITH [place]  . TONSILLECTOMY  1990    Family History  Problem Relation Age of Onset  . Colon cancer Brother   . Colon cancer Maternal Uncle   . Diabetes Mellitus II Father   . Hypertension Daughter   . Prostate cancer Paternal Uncle   . Kidney disease Neg Hx     Social History   Social History  . Marital status: Married    Spouse name: N/A  . Number of children: N/A  . Years of education: N/A   Occupational History  . Not on file.   Social History Main Topics  . Smoking status: Never Smoker  . Smokeless tobacco: Never Used  . Alcohol use No  . Drug use: No  . Sexual  activity: Not on file   Other Topics Concern  . Not on file   Social History Narrative  . No narrative on file     Current Outpatient Prescriptions:  .  clonazePAM (KLONOPIN) 0.5 MG tablet, Take 1 tablet (0.5 mg total) by mouth 2 (two) times daily as needed for anxiety., Disp: 60 tablet, Rfl: 2 .  FLUTICASONE PROPIONATE HFA IN, Place into the nose., Disp: , Rfl:  .  Fluticasone-Salmeterol (ADVAIR DISKUS) 250-50 MCG/DOSE AEPB, Inhale into the lungs. Reported on 09/01/2015, Disp: , Rfl:  .  Icosapent Ethyl 1 g CAPS, Take 2 g by mouth 2 (two) times daily with a meal., Disp: 360 capsule, Rfl: 0 .  levalbuterol (XOPENEX HFA) 45 MCG/ACT inhaler, Inhale into the lungs., Disp: , Rfl:  .  PARoxetine (PAXIL) 20 MG tablet, Take 1 tablet (20 mg total) by mouth 2 (two) times daily., Disp: 180 tablet, Rfl: 1 .  tamsulosin (FLOMAX) 0.4 MG CAPS capsule, Take 0.4 mg by mouth. Reported on 09/01/2015, Disp: , Rfl:  .  ondansetron (ZOFRAN) 4 MG tablet, Take 1 tablet (4 mg total) by mouth every 8 (eight) hours as needed for nausea or vomiting. (Patient not taking: Reported on 04/03/2016), Disp: 30 tablet, Rfl: 0  No Known  Allergies   Review of Systems  Psychiatric/Behavioral: The patient is not nervous/anxious.      Objective  Vitals:   04/03/16 1411  BP: 132/78  Pulse: 78  Resp: 16  Temp: 98.9 F (37.2 C)  TempSrc: Oral  SpO2: 97%  Weight: 261 lb 8 oz (118.6 kg)  Height: 6\' 1"  (1.854 m)    Physical Exam  Constitutional: He is oriented to person, place, and time and well-developed, well-nourished, and in no distress.  HENT:  Head: Normocephalic and atraumatic.  Cardiovascular: Normal rate and regular rhythm.   No murmur heard. Pulmonary/Chest: Effort normal and breath sounds normal. He has no wheezes.  Neurological: He is alert and oriented to person, place, and time.  Psychiatric: Mood, memory, affect and judgment normal.  Nursing note and vitals reviewed.     Assessment & Plan  1.  Anxiety, generalized Stable, taking clonazepam and Paxil as prescribed. - clonazePAM (KLONOPIN) 0.5 MG tablet; Take 1 tablet (0.5 mg total) by mouth 2 (two) times daily as needed for anxiety.  Dispense: 60 tablet; Refill: 2 - PARoxetine (PAXIL) 20 MG tablet; Take 1 tablet (20 mg total) by mouth 2 (two) times daily.  Dispense: 180 tablet; Refill: 1  2. Hypertriglyceridemia  - Lipid Profile - COMPLETE METABOLIC PANEL WITH GFR    Oneka Parada [facility] 04/04/2016 10:14 AM

## 2016-04-07 ENCOUNTER — Telehealth: Payer: Self-pay

## 2016-04-07 MED ORDER — ICOSAPENT ETHYL 1 G PO CAPS: 2.0000 g | ORAL_CAPSULE | Freq: Two times a day (BID) | ORAL | 0 refills | Status: DC

## 2016-04-07 NOTE — Telephone Encounter (Signed)
Patient has been notified of lab results and prescription of Vascepa 2 g twice daily has been refilled and sent to [facility] [employer] on [address] per Dr. Manuella Ghazi, patient has been notified and verbalized understanding

## 2016-05-10 IMAGING — CR DG ABDOMEN 1V
1 series · 2 of 2 positions shown · non-contrast
Comparison: None.

CLINICAL DATA: Lithotripsy.

EXAM:
ABDOMEN - 1 VIEW

[Series 1: dg abd 1 view · 0.14mm/px · 2 of 2 slices shown]
[im 1/2]
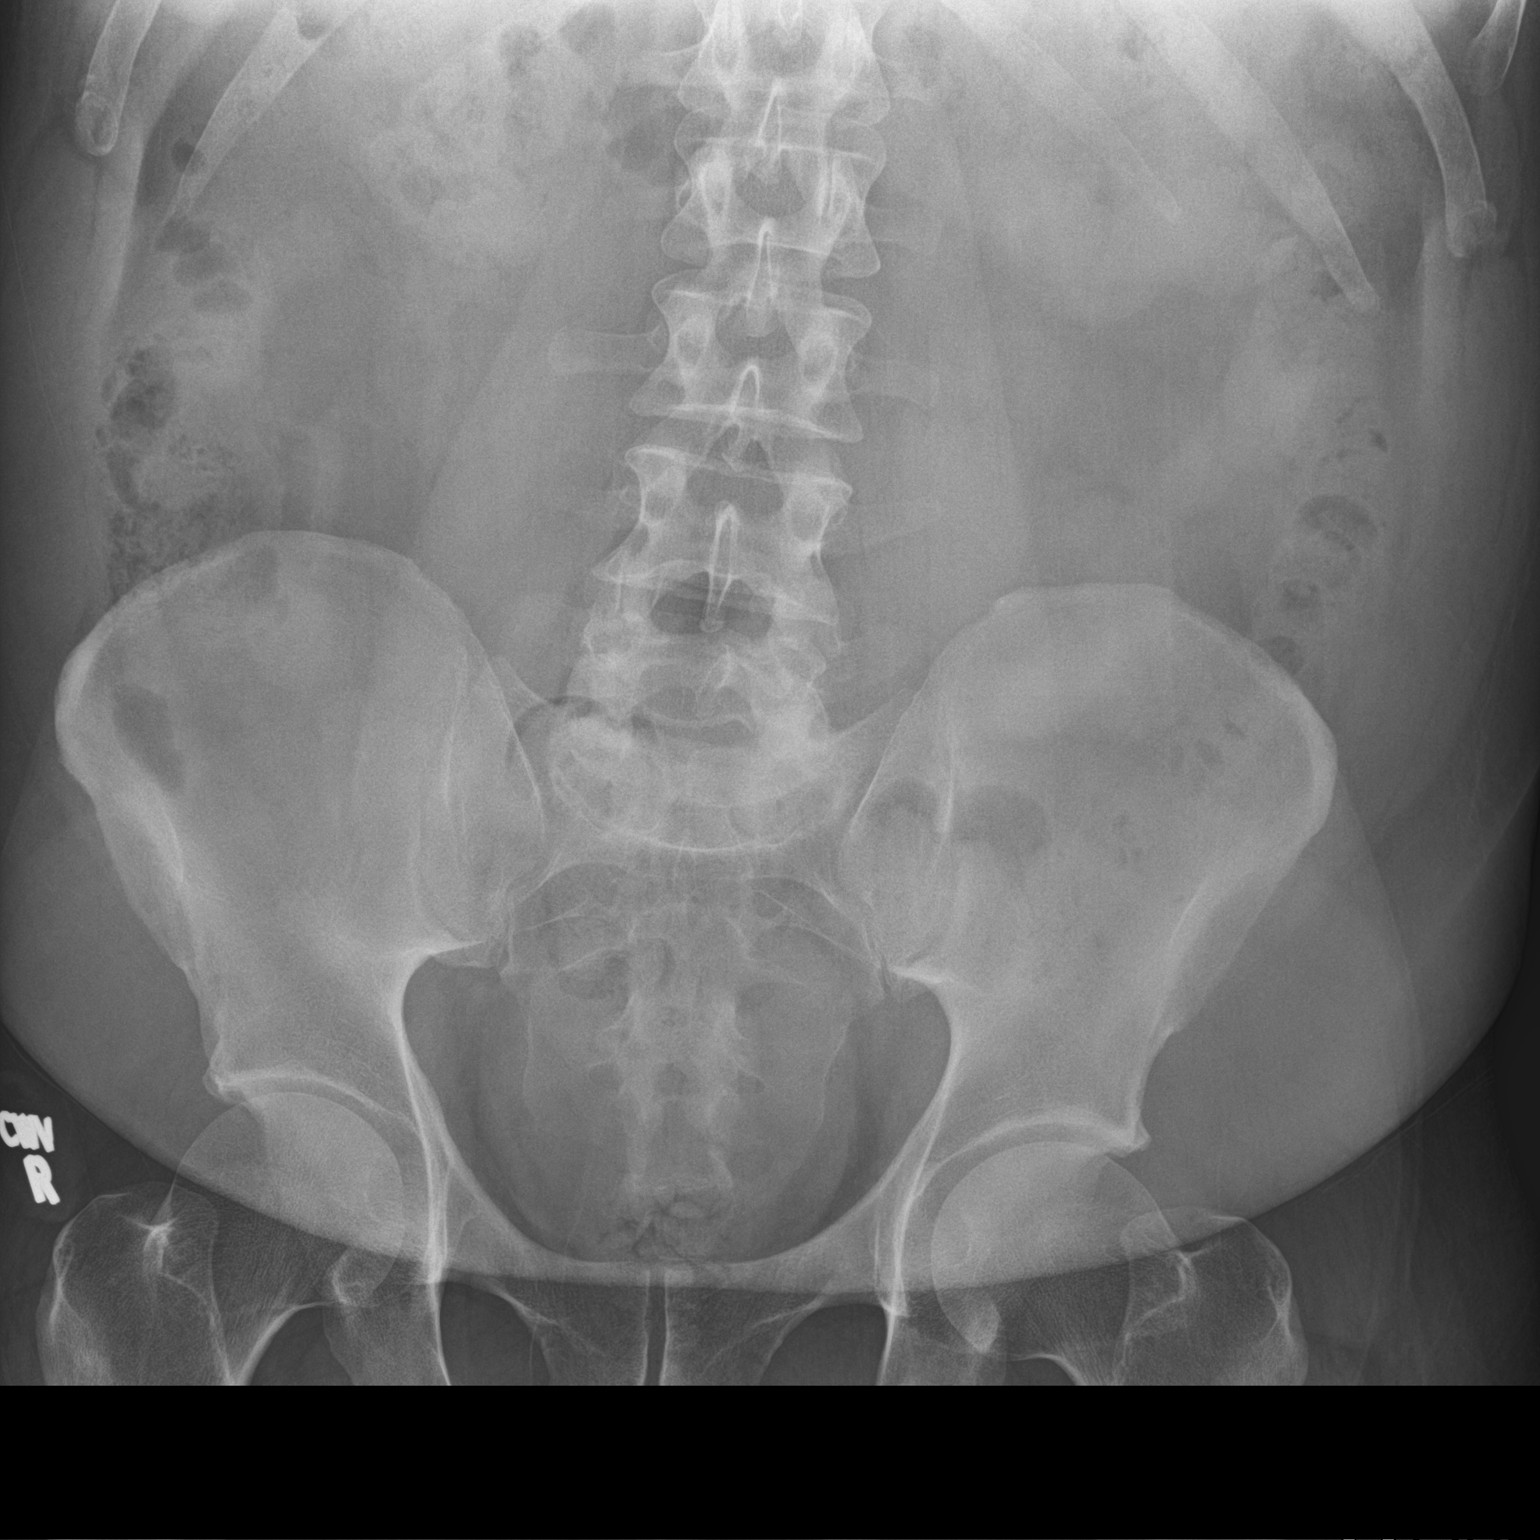
[im 2/2]
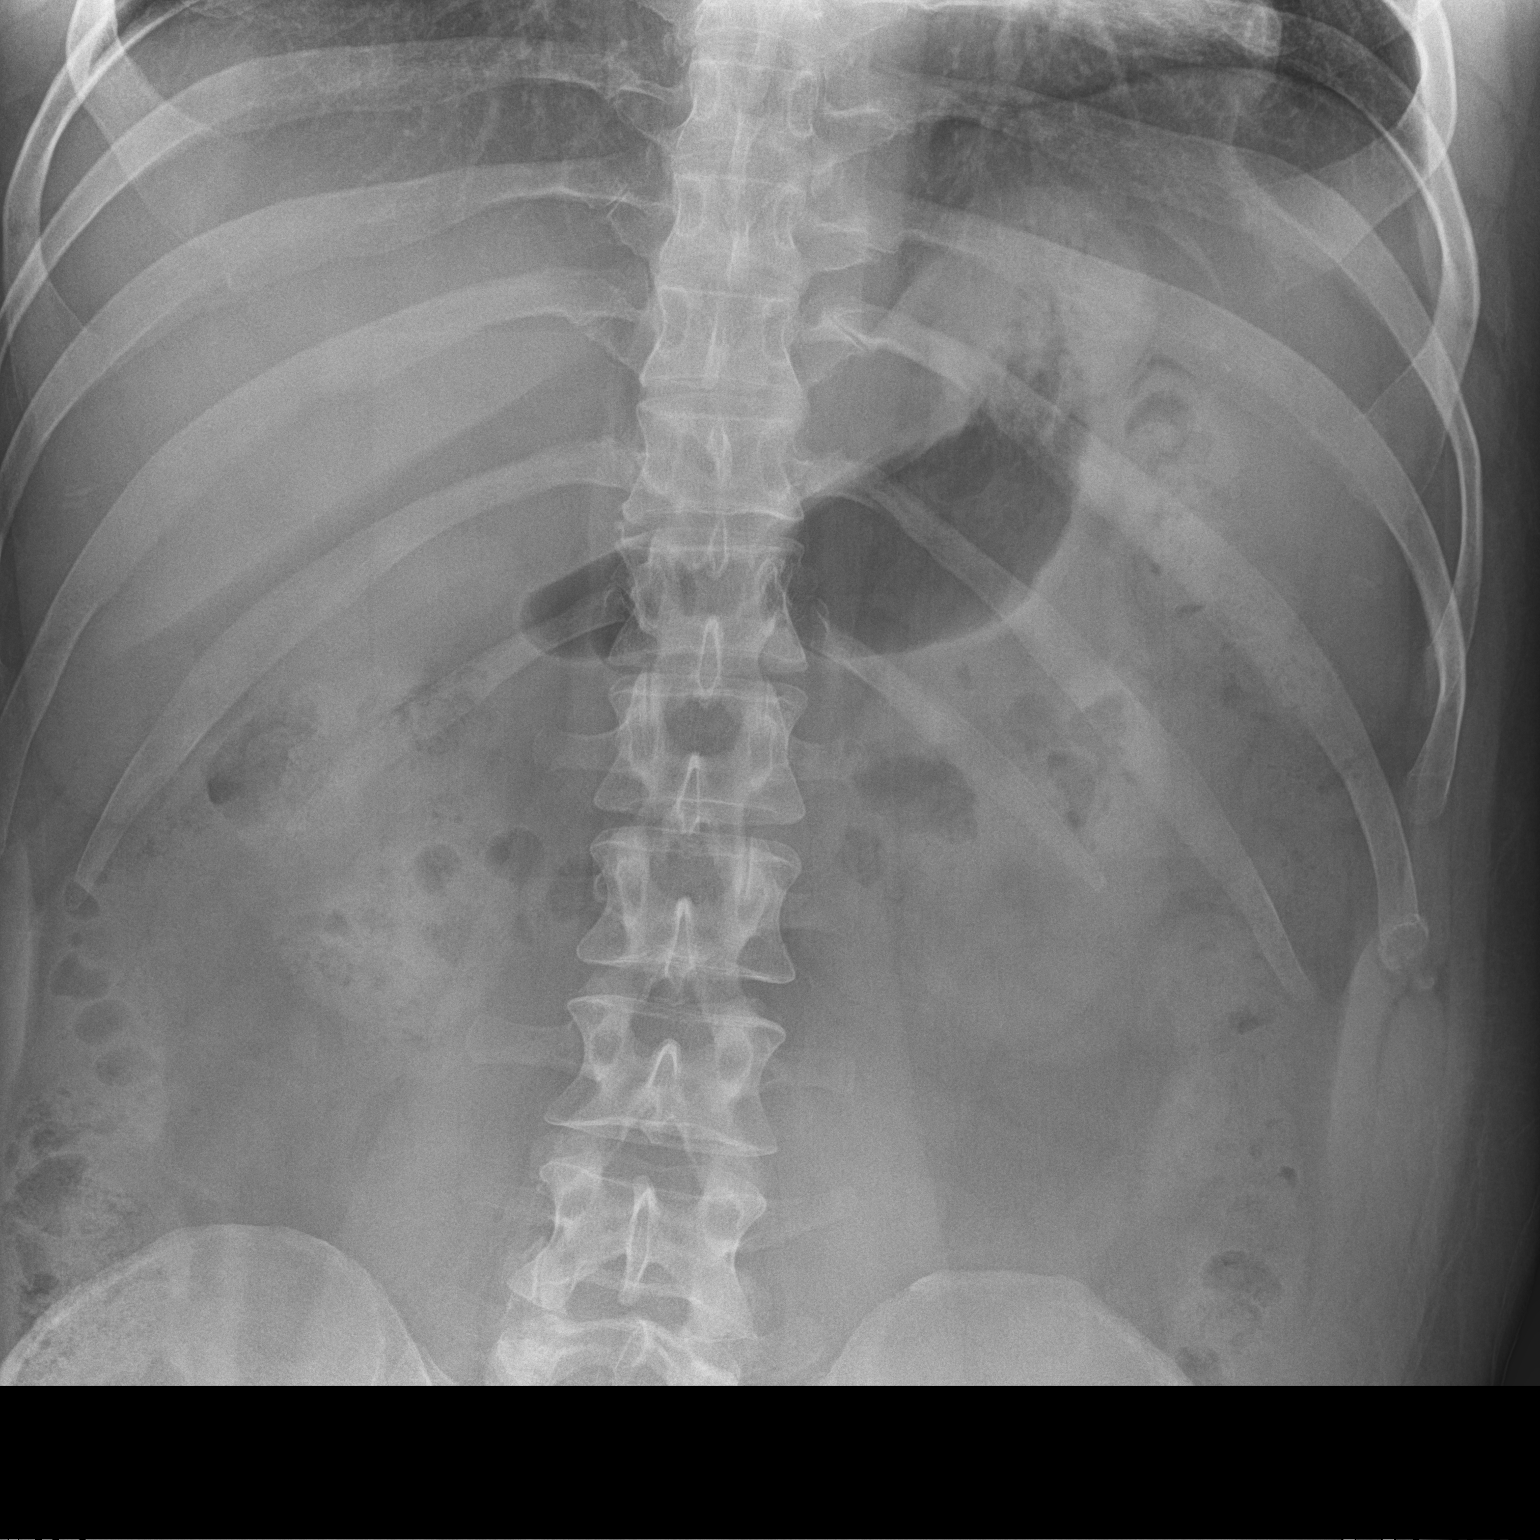

[2 of 2 positions shown; findings below may reference images not displayed]

FINDINGS: Previously identified 5 mm stone projected over the right kidney/
proximal ureter no longer identified. Gas pattern nonspecific. No
acute bony abnormality.
IMPRESSION: Previous identified 5 mm stone projected over the right
kidney/proximal right ureter no longer identified.

## 2016-06-02 ENCOUNTER — Encounter: Payer: Self-pay | Admitting: Family Medicine

## 2016-06-02 ENCOUNTER — Ambulatory Visit (INDEPENDENT_AMBULATORY_CARE_PROVIDER_SITE_OTHER): Payer: PRIVATE HEALTH INSURANCE | Admitting: Family Medicine

## 2016-06-02 VITALS — BP 124/72 | HR 81 | Temp 97.8°F | Resp 18 | Ht 73.0 in | Wt 262.8 lb

## 2016-06-02 DIAGNOSIS — Z23 Encounter for immunization: Secondary | ICD-10-CM | POA: Diagnosis not present

## 2016-06-02 DIAGNOSIS — F411 Generalized anxiety disorder: Secondary | ICD-10-CM | POA: Diagnosis not present

## 2016-06-02 MED ORDER — ALPRAZOLAM 0.5 MG PO TABS: 0.5000 mg | ORAL_TABLET | Freq: Two times a day (BID) | ORAL | 2 refills | Status: DC | PRN

## 2016-06-02 MED ORDER — PAROXETINE HCL 20 MG PO TABS: 20.0000 mg | ORAL_TABLET | Freq: Two times a day (BID) | ORAL | 1 refills | Status: DC

## 2016-06-02 NOTE — Progress Notes (Signed)
Name: Kenneth Moreno   MRN: [id number]    DOB: February 04, 1971   Date:06/02/2016       Progress Note  Subjective  Chief Complaint  Chief Complaint  Patient presents with  . Anxiety    would like Clonazepam changed back to Xanax    Anxiety  Presents for follow-up visit. Symptoms include excessive worry, nervous/anxious behavior and panic. Patient reports no depressed mood, insomnia, irritability or muscle tension. Primary symptoms comment: mainly work stress. The severity of symptoms is moderate (he would like to change Clonazepam to Xanax as Clonazepam is not working as well as it used to). The quality of sleep is good.      Past Medical History:  Diagnosis Date  . Airway hyperreactivity   . Anxiety   . Asthma   . Depression   . Hematuria   . HLD (hyperlipidemia)   . Hypertriglyceridemia   . Kidney stone on right side   . Microscopic hematuria   . Obesity   . Post-nasal drainage   . Renal disorder   . Restless leg   . Right ureteral stone   . RUQ pain   . Sleep apnea   . Splenomegaly   . Vertigo, benign positional     Past Surgical History:  Procedure Laterality Date  . ganglion cyst removal  2006  . LITHOTRIPSY    . SINUS SURGERY WITH [place]  . TONSILLECTOMY  1990    Family History  Problem Relation Age of Onset  . Colon cancer Brother   . Colon cancer Maternal Uncle   . Diabetes Mellitus II Father   . Hypertension Daughter   . Prostate cancer Paternal Uncle   . Kidney disease Neg Hx     Social History   Social History  . Marital status: Married    Spouse name: N/A  . Number of children: N/A  . Years of education: N/A   Occupational History  . Not on file.   Social History Main Topics  . Smoking status: Never Smoker  . Smokeless tobacco: Never Used  . Alcohol use No  . Drug use: No  . Sexual activity: Not on file   Other Topics Concern  . Not on file   Social History Narrative  . No narrative on file     Current Outpatient  Prescriptions:  .  clonazePAM (KLONOPIN) 0.5 MG tablet, Take 1 tablet (0.5 mg total) by mouth 2 (two) times daily as needed for anxiety., Disp: 60 tablet, Rfl: 2 .  PARoxetine (PAXIL) 20 MG tablet, Take 1 tablet (20 mg total) by mouth 2 (two) times daily., Disp: 180 tablet, Rfl: 1  No Known Allergies   Review of Systems  Constitutional: Negative for irritability.  Psychiatric/Behavioral: Negative for depression. The patient is nervous/anxious. The patient does not have insomnia.     Objective  Vitals:   06/02/16 0834  BP: 124/72  Pulse: 81  Resp: 18  Temp: 97.8 F (36.6 C)  TempSrc: Oral  SpO2: 93%  Weight: 262 lb 12.8 oz (119.2 kg)  Height: 6\' 1"  (1.854 m)    Physical Exam  Constitutional: He is oriented to person, place, and time and well-developed, well-nourished, and in no distress.  HENT:  Head: Normocephalic and atraumatic.  Cardiovascular: Normal rate, regular rhythm and normal heart sounds.   No murmur heard. Pulmonary/Chest: Effort normal and breath sounds normal. He has no wheezes.  Neurological: He is alert and oriented to person, place, and time.  Psychiatric: Mood,  memory, affect and judgment normal.  Nursing note and vitals reviewed.      Assessment & Plan  1. Anxiety, generalized Mainly burdened by the increased work stress, believes clonazepam is not as effective as Xanax was. We'll change back to Xanax at 0.5 g twice a day when necessary, continue on paroxetine unchanged. Refills provided and follow-up in 3 months - PARoxetine (PAXIL) 20 MG tablet; Take 1 tablet (20 mg total) by mouth 2 (two) times daily.  Dispense: 180 tablet; Refill: 1 - ALPRAZolam (XANAX) 0.5 MG tablet; Take 1 tablet (0.5 mg total) by mouth 2 (two) times daily as needed for anxiety.  Dispense: 60 tablet; Refill: 2  2. Need for influenza vaccination  - Flu Vaccine QUAD 36+ mos PF IM (Fluarix & Fluzone Quad PF)  Kenneth Moreno [facility] 06/02/2016 8:40 AM

## 2016-08-11 ENCOUNTER — Telehealth: Payer: Self-pay | Admitting: *Deleted

## 2016-08-11 ENCOUNTER — Ambulatory Visit: Payer: PRIVATE HEALTH INSURANCE

## 2016-08-11 NOTE — Telephone Encounter (Signed)
-----   Message from Wallene Dales sent at 08/11/2016  8:19 AM EDT ----- Regarding: Cancel Appt Left Pt VM to cancel appt. I attempted to contact him yesterday to cancel also. Will call back later today.

## 2016-08-14 ENCOUNTER — Inpatient Hospital Stay: Payer: PRIVATE HEALTH INSURANCE

## 2016-08-14 ENCOUNTER — Inpatient Hospital Stay: Payer: PRIVATE HEALTH INSURANCE | Admitting: Internal Medicine

## 2016-08-14 NOTE — Assessment & Plan Note (Deleted)
#   Splenomegaly-  Unclear etiology.   ULTRASOUND DONE IN April 2017-  STABLE SLIGHTLY DECREASED VOLUME OF THE SPLEEN COMPARED TO  Ultrasound in august 2016.  #  As the patient is asymptomatic i would not recommend any further workup at this time.  Again discussed the potential symptoms that might go with lymphoma/ splenomegaly-  Weight loss profuse night sweats and fevers .  Patient will call if he has any new symptoms.  #  The patient will follow-up with me in one year with labs/  Limited ultrasound of his spleen.

## 2016-08-14 NOTE — Progress Notes (Deleted)
[facility] NOTE  Patient Care Team: Roselee Nova, MD as PCP - General (Family Medicine) Roselee Nova, MD (Family Medicine)  CHIEF COMPLAINTS/PURPOSE OF CONSULTATION:  Splenomegaly.  # Aug 2016- MILD to MOD SPLENOMEGALY; Oct CT- no LAD;  April 2017- Stable/Improved.  HISTORY OF PRESENTING ILLNESS:  Kenneth Moreno 46 y.o. male noted to have a splenomegaly on ultrasound in August 2016;  Patient is here for follow-up/  Review the follow-up ultrasound.  Patient's appetite is good. He denies any weight loss.  No night sweats. No early satiety.  ROS: A complete 10 point review of system is done which is negative except mentioned above in history of present illness  Social history:  No smoking or alcohol. Patient  Lives with his wife.  He works  In Field se[employer]ologist.  MEDICAL HISTORY:  Past Medical History:  Diagnosis Date  . Airway hyperreactivity   . Anxiety   . Asthma   . Depression   . Hematuria   . HLD (hyperlipidemia)   . Hypertriglyceridemia   . Kidney stone on right side   . Microscopic hematuria   . Obesity   . Post-nasal drainage   . Renal disorder   . Restless leg   . Right ureteral stone   . RUQ pain   . Sleep apnea   . Splenomegaly   . Vertigo, benign positional     SURGICAL HISTORY: Past Surgical History:  Procedure Laterality Date  . ganglion cyst removal  2006  . LITHOTRIPSY    . SINUS SURGERY WITH [place]  . TONSILLECTOMY  1990    SOCIAL HISTORY: Social History   Social History  . Marital status: Married    Spouse name: N/A  . Number of children: N/A  . Years of education: N/A   Occupational History  . Not on file.   Social History Main Topics  . Smoking status: Never Smoker  . Smokeless tobacco: Never Used  . Alcohol use No  . Drug use: No  . Sexual activity: Not on file   Other Topics Concern  . Not on file   Social History Narrative  . No narrative on file    FAMILY HISTORY: Family  History  Problem Relation Age of Onset  . Colon cancer Brother   . Colon cancer Maternal Uncle   . Diabetes Mellitus II Father   . Hypertension Daughter   . Prostate cancer Paternal Uncle   . Kidney disease Neg Hx     ALLERGIES:  has No Known Allergies.  MEDICATIONS:  Current Outpatient Prescriptions  Medication Sig Dispense Refill  . ALPRAZolam (XANAX) 0.5 MG tablet Take 1 tablet (0.5 mg total) by mouth 2 (two) times daily as needed for anxiety. 60 tablet 2  . PARoxetine (PAXIL) 20 MG tablet Take 1 tablet (20 mg total) by mouth 2 (two) times daily. 180 tablet 1   No current facility-administered medications for this visit.       Marland Kitchen  PHYSICAL EXAMINATION: ECOG PERFORMANCE STATUS: 1 - Symptomatic but completely ambulatory  There were no vitals filed for this visit. There were no vitals filed for this visit.  GENERAL: Well-nourished well-developed; Alert, no distress and comfortable. He is alone.  EYES: no pallor or icterus OROPHARYNX: no thrush or ulceration; good dentition  NECK: supple, no masses felt LYMPH: no palpable lymphadenopathy in the cervical, axillary or inguinal regions LUNGS: clear to auscultation and No wheeze or crackles HEART/CVS: regular rate &  rhythm and no murmurs; No lower extremity edema ABDOMEN: abdomen soft, non-tender and normal bowel sounds; 4 fingers breath below the left costal margin. Musculoskeletal:no cyanosis of digits and no clubbing  PSYCH: alert & oriented x 3 with fluent speech NEURO: no focal motor/sensory deficits SKIN: no rashes or significant lesions  LABORATORY DATA:  I have reviewed the data as listed Lab Results  Component Value Date   WBC 5.4 12/28/2014   HGB 15.7 04/22/2012   HCT 40.1 12/28/2014   MCV 82 12/28/2014   PLT 227 12/28/2014    Recent Labs  04/03/16 1454  NA 139  K 4.5  CL 102  CO2 29  GLUCOSE 88  BUN 15  CREATININE 0.95  CALCIUM 9.7  GFRNONAA >89  GFRAA >89  PROT 6.8  ALBUMIN 4.6  AST 21   ALT 38  ALKPHOS 42  BILITOT 0.5    RADIOGRAPHIC STUDIES: I have personally reviewed the radiological images as listed and agreed with the findings in the report. No results found.  ASSESSMENT & PLAN:   No problem-specific Assessment & Plan notes found for this encounter.       Cammie Sickle, MD 08/14/2016 8:33 AM

## 2016-08-19 IMAGING — CR DG CHEST 2V
1 series · 2 of 2 positions shown · non-contrast
Comparison: 07/19/2009

CLINICAL DATA: Cough and fever.

EXAM:
CHEST  2 VIEW

[Series 1: dg chest 2 view · 0.14mm/px · 2 of 2 slices shown]
[im 1/2]
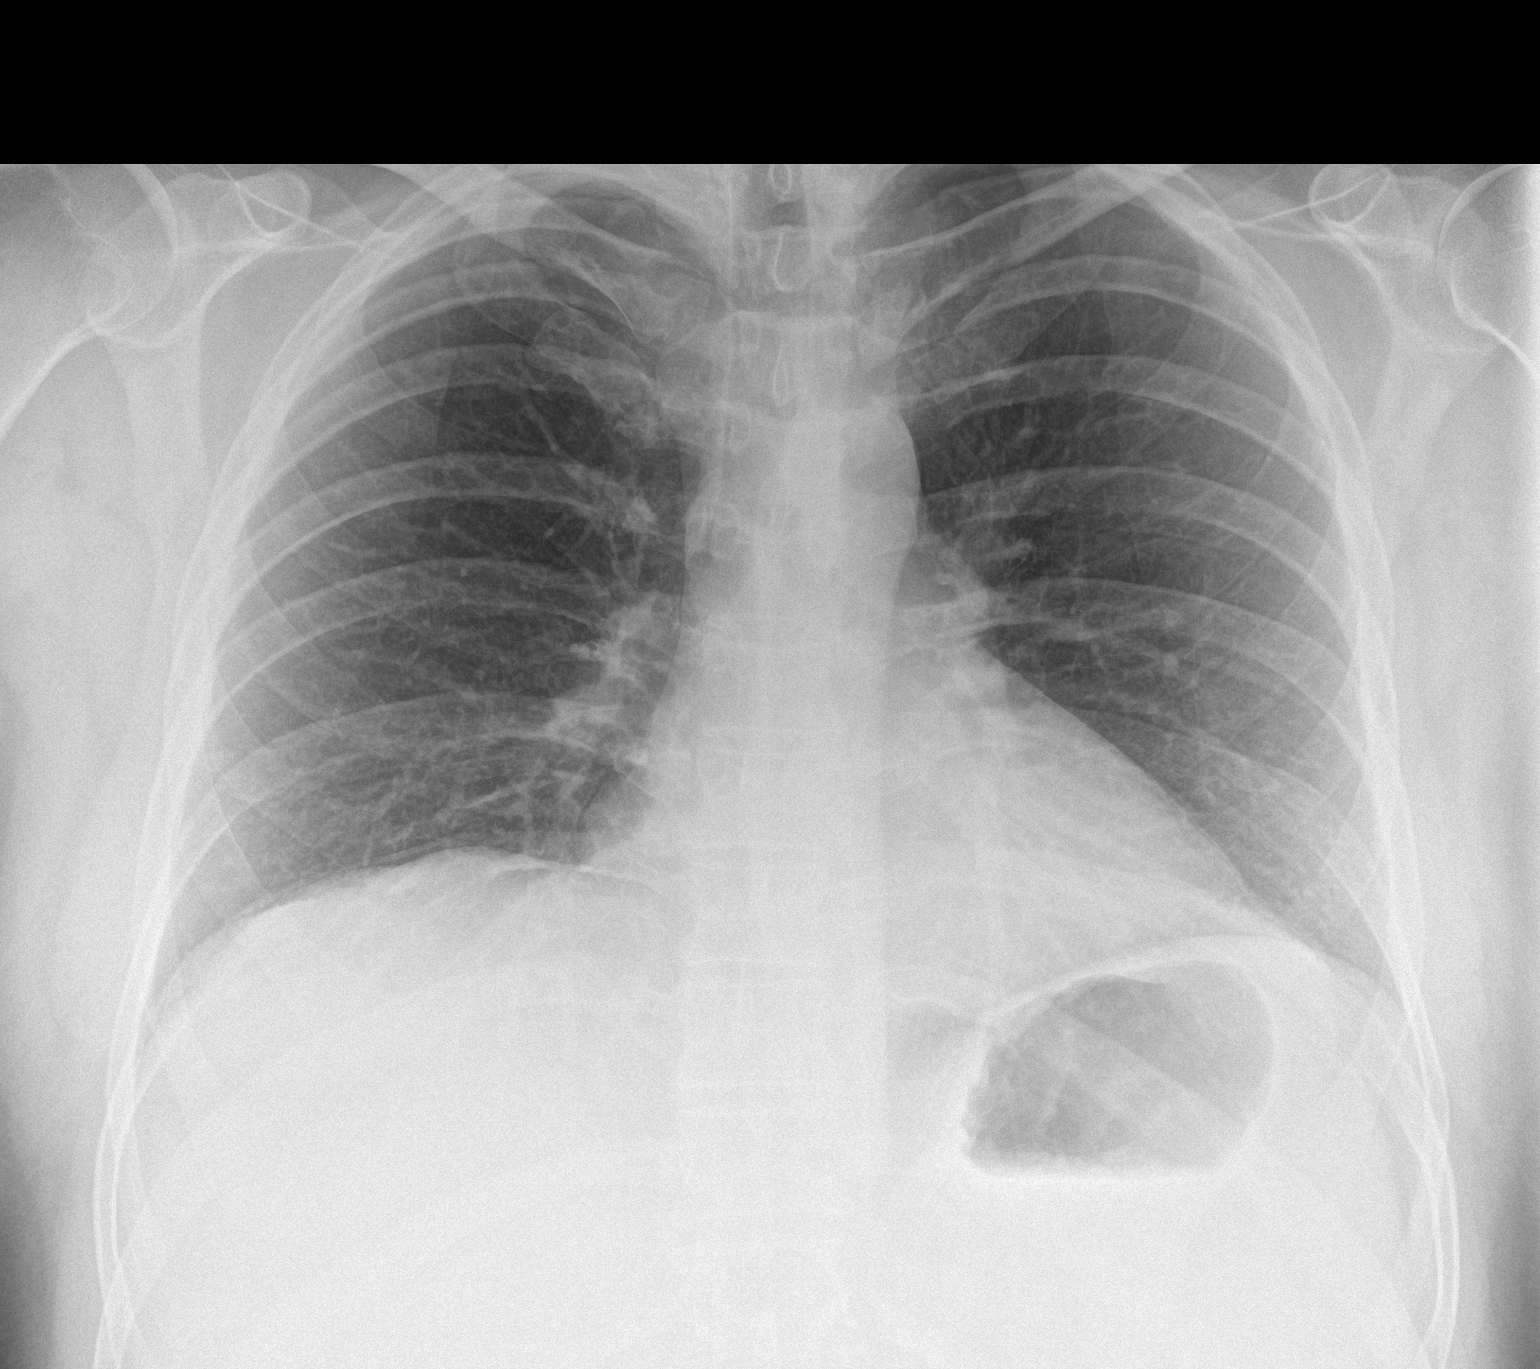
[im 2/2]
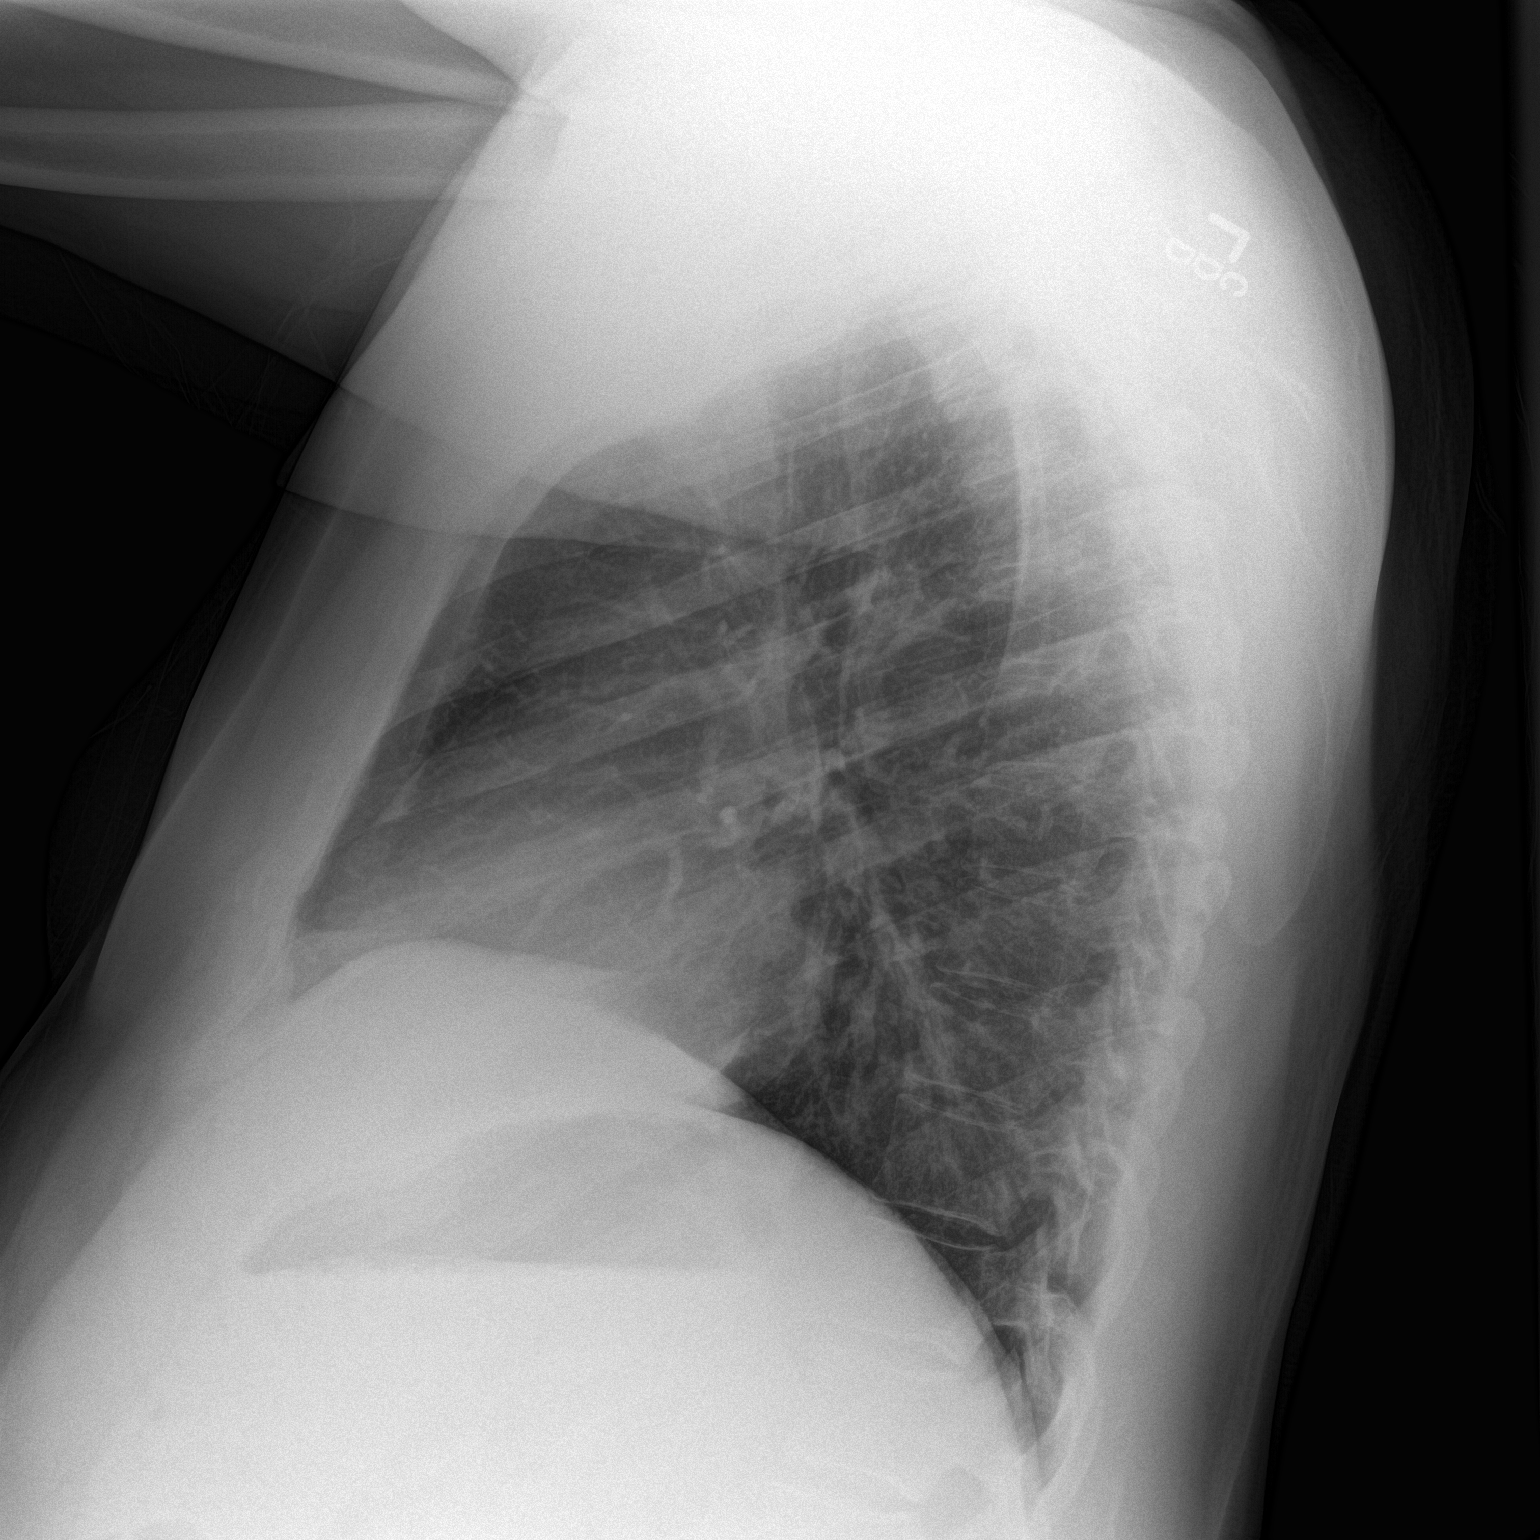

[2 of 2 positions shown; findings below may reference images not displayed]

FINDINGS: The heart size and mediastinal contours are within normal limits.
Both lungs are clear. The visualized skeletal structures are
unremarkable.
IMPRESSION: No active cardiopulmonary disease.

## 2016-08-30 NOTE — Progress Notes (Deleted)
08/31/2016 8:43 PM   Kenneth Moreno [birth year] [id number]  Referring provider: Roselee Nova, MD [address] [place], [place] [address]  No chief complaint on file.   HPI: Patient is a 46 year old Caucasian male with a history of nephrolithiasis who presents today for a yearly follow up.    He underwent  ESWL on 08/20/14 for right UPJ stone.  He passed all fragments.  The stone composition was 75% calcium oxalate monohydrate, 10% calcium oxalate dihydrate and 2% calcium phosphate carbonate.    He has not had any flank pain, passage of fragments or gross hematuria since his last visit with [place] one year ago.  He has not had any recent fevers, chills, nausea or vomiting.  His UA today is normal.  His KUB taken today did not demonstrate any distinct stones.  I have reviewed the films with the patient.    PMH: Past Medical History:  Diagnosis Date  . Airway hyperreactivity   . Anxiety   . Asthma   . Depression   . Hematuria   . HLD (hyperlipidemia)   . Hypertriglyceridemia   . Kidney stone on right side   . Microscopic hematuria   . Obesity   . Post-nasal drainage   . Renal disorder   . Restless leg   . Right ureteral stone   . RUQ pain   . Sleep apnea   . Splenomegaly   . Vertigo, benign positional     Surgical History: Past Surgical History:  Procedure Laterality Date  . ganglion cyst removal  2006  . LITHOTRIPSY    . SINUS SURGERY WITH [place]  . TONSILLECTOMY  1990    Home Medications:  Allergies as of 08/31/2016   No Known Allergies     Medication List       Accurate as of 08/30/16  8:43 PM. Always use your most recent med list.          ALPRAZolam 0.5 MG tablet Commonly known as:  XANAX Take 1 tablet (0.5 mg total) by mouth 2 (two) times daily as needed for anxiety.   PARoxetine 20 MG tablet Commonly known as:  PAXIL Take 1 tablet (20 mg total) by mouth 2 (two) times daily.       Allergies: No Known  Allergies  Family History: Family History  Problem Relation Age of Onset  . Colon cancer Brother   . Colon cancer Maternal Uncle   . Diabetes Mellitus II Father   . Hypertension Daughter   . Prostate cancer Paternal Uncle   . Kidney disease Neg Hx     Social History:  reports that he has never smoked. He has never used smokeless tobacco. He reports that he does not drink alcohol or use drugs.  ROS:                                        Physical Exam: There were no vitals taken for this visit.  Constitutional: Well nourished. Alert and oriented, No acute distress. HEENT: [place] AT, moist mucus membranes. Trachea midline, no masses. Cardiovascular: No clubbing, cyanosis, or edema. Respiratory: Normal respiratory effort, no increased work of breathing. GI: Abdomen is soft, non tender, non distended, no abdominal masses. Liver and spleen not palpable.  No hernias appreciated.  Stool sample for occult testing is not indicated.   GU: No CVA  tenderness.  No bladder fullness or masses.  Patient with circumcised phallus.  Urethral meatus is patent.  No penile discharge. No penile lesions or rashes. Scrotum without lesions, cysts, rashes and/or edema.  Testicles are located scrotally bilaterally. No masses are appreciated in the testicles. Left and right epididymis are normal. Rectal: Patient with  normal sphincter tone. Anus and perineum without scarring or rashes. No rectal masses are appreciated. Prostate is approximately 50 grams, no nodules are appreciated. Seminal vesicles are normal. Skin: No rashes, bruises or suspicious lesions. Lymph: No cervical or inguinal adenopathy. Neurologic: Grossly intact, no focal deficits, moving all 4 extremities. Psychiatric: Normal mood and affect.  Laboratory Data: Lab Results  Component Value Date   WBC 5.4 12/28/2014   HGB 15.7 04/22/2012   HCT 40.1 12/28/2014   MCV 82 12/28/2014   PLT 227 12/28/2014    Lab Results   Component Value Date   CREATININE 0.95 04/03/2016    Lab Results  Component Value Date   TSH 0.811 12/28/2014       Component Value Date/Time   CHOL 176 04/03/2016 1454   CHOL 170 07/26/2015 0923   HDL 25 (L) 04/03/2016 1454   HDL 27 (L) 07/26/2015 0923   CHOLHDL 7.0 (H) 04/03/2016 1454   VLDL NOT CALC 04/03/2016 1454   LDLCALC NOT CALC 04/03/2016 1454   [place] Comment 07/26/2015 0923    Lab Results  Component Value Date   AST 21 04/03/2016   Lab Results  Component Value Date   ALT 38 04/03/2016     Results for orders placed or performed in visit on 04/03/16  Lipid Profile  Result Value Ref Range   Cholesterol 176 <200 mg/dL   Triglycerides 712 (H) <150 mg/dL   HDL 25 (L) >40 mg/dL   Total CHOL/HDL Ratio 7.0 (H) <5.0 Ratio   VLDL NOT CALC <30 mg/dL   LDL Cholesterol NOT CALC <100 mg/dL  COMPLETE METABOLIC PANEL WITH GFR  Result Value Ref Range   Sodium 139 135 - 146 mmol/L   Potassium 4.5 3.5 - 5.3 mmol/L   Chloride 102 98 - 110 mmol/L   CO2 29 20 - 31 mmol/L   Glucose, Bld 88 65 - 99 mg/dL   BUN 15 7 - 25 mg/dL   Creat 0.95 0.60 - 1.35 mg/dL   Total Bilirubin 0.5 0.2 - 1.2 mg/dL   Alkaline Phosphatase 42 40 - 115 U/L   AST 21 10 - 40 U/L   ALT 38 9 - 46 U/L   Total Protein 6.8 6.1 - 8.1 g/dL   Albumin 4.6 3.6 - 5.1 g/dL   Calcium 9.7 8.6 - 10.3 mg/dL   GFR, Est African American >89 >=60 mL/min   GFR, Est Non African American >89 >=60 mL/min    Pertinent Imaging: ***  Assessment & Plan:    1. History of nephrolithiasis  - KUB taking today ***  - Patient will contact our office if should experience flank pain of gross hematuria.      No Follow-up on file.  These notes generated with voice recognition software. I apologize for typographical errors.  Zara Council, [facility] [address], [place], [place] [address] [phone]

## 2016-08-31 ENCOUNTER — Encounter: Payer: Self-pay | Admitting: Urology

## 2016-08-31 ENCOUNTER — Ambulatory Visit: Payer: PRIVATE HEALTH INSURANCE | Admitting: Urology

## 2016-09-04 ENCOUNTER — Encounter: Payer: Self-pay | Admitting: Family Medicine

## 2016-09-04 ENCOUNTER — Ambulatory Visit (INDEPENDENT_AMBULATORY_CARE_PROVIDER_SITE_OTHER): Payer: PRIVATE HEALTH INSURANCE | Admitting: Family Medicine

## 2016-09-04 VITALS — BP 126/78 | HR 75 | Temp 97.8°F | Resp 16 | Ht 73.0 in | Wt 269.1 lb

## 2016-09-04 DIAGNOSIS — E669 Obesity, unspecified: Secondary | ICD-10-CM

## 2016-09-04 DIAGNOSIS — F411 Generalized anxiety disorder: Secondary | ICD-10-CM

## 2016-09-04 MED ORDER — PAROXETINE HCL 20 MG PO TABS: 20.0000 mg | ORAL_TABLET | Freq: Two times a day (BID) | ORAL | 1 refills | Status: DC

## 2016-09-04 NOTE — Progress Notes (Signed)
Name: Kenneth Moreno   MRN: [id number]    DOB: November 15, 1970   Date:09/04/2016       Progress Note  Subjective  Chief Complaint  Chief Complaint  Patient presents with  . Follow-up    3 mo  . Medication Refill    Anxiety  Presents for follow-up visit. Symptoms include excessive worry, nervous/anxious behavior and panic. Patient reports no depressed mood, insomnia, irritability or muscle tension. Primary symptoms comment: mainly work stress. The severity of symptoms is moderate (he would like to change Clonazepam to Xanax as Clonazepam is not working as well as it used to). The quality of sleep is good.    Obesity: Patient Is considered obese with BMI of 35.50 kg/m, weight is 269 pounds, patient believes that he is not able to do things that he used to and he believes it's mainly because of weight. He eats typically balanced diet but is not on any specific diet plan to lower his weight. He is moderately physically active.    Past Medical History:  Diagnosis Date  . Airway hyperreactivity   . Anxiety   . Asthma   . Depression   . Hematuria   . HLD (hyperlipidemia)   . Hypertriglyceridemia   . Kidney stone on right side   . Microscopic hematuria   . Obesity   . Post-nasal drainage   . Renal disorder   . Restless leg   . Right ureteral stone   . RUQ pain   . Sleep apnea   . Splenomegaly   . Vertigo, benign positional     Past Surgical History:  Procedure Laterality Date  . ganglion cyst removal  2006  . LITHOTRIPSY    . SINUS SURGERY WITH [place]  . TONSILLECTOMY  1990    Family History  Problem Relation Age of Onset  . Colon cancer Brother   . Colon cancer Maternal Uncle   . Diabetes Mellitus II Father   . Hypertension Daughter   . Prostate cancer Paternal Uncle   . Kidney disease Neg Hx     Social History   Social History  . Marital status: Married    Spouse name: N/A  . Number of children: N/A  . Years of education: N/A   Occupational History   . Not on file.   Social History Main Topics  . Smoking status: Never Smoker  . Smokeless tobacco: Never Used  . Alcohol use No  . Drug use: No  . Sexual activity: Not on file   Other Topics Concern  . Not on file   Social History Narrative  . No narrative on file     Current Outpatient Prescriptions:  .  ALPRAZolam (XANAX) 0.5 MG tablet, Take 1 tablet (0.5 mg total) by mouth 2 (two) times daily as needed for anxiety., Disp: 60 tablet, Rfl: 2 .  PARoxetine (PAXIL) 20 MG tablet, Take 1 tablet (20 mg total) by mouth 2 (two) times daily., Disp: 180 tablet, Rfl: 1  Allergies  Allergen Reactions  . Penicillins Diarrhea     Review of Systems  Constitutional: Negative for irritability.  Psychiatric/Behavioral: The patient is nervous/anxious. The patient does not have insomnia.       Objective  Vitals:   09/04/16 0841  BP: 126/78  Pulse: 75  Resp: 16  Temp: 97.8 F (36.6 C)  TempSrc: Oral  SpO2: 96%  Weight: 269 lb 1.6 oz (122.1 kg)  Height: 6\' 1"  (1.854 m)    Physical Exam  Constitutional: He is oriented to person, place, and time and well-developed, well-nourished, and in no distress.  HENT:  Head: Normocephalic and atraumatic.  Cardiovascular: Normal rate and regular rhythm.   No murmur heard. Pulmonary/Chest: Effort normal and breath sounds normal. He has no wheezes.  Abdominal: Soft. Bowel sounds are normal. There is no tenderness.  Neurological: He is alert and oriented to person, place, and time.  Psychiatric: Mood, memory, affect and judgment normal.  Nursing note and vitals reviewed.       Assessment & Plan  1. Anxiety, generalized Stable and responsive to Paxil taken twice daily, refills provided - PARoxetine (PAXIL) 20 MG tablet; Take 1 tablet (20 mg total) by mouth 2 (two) times daily.  Dispense: 180 tablet; Refill: 1  2. Obesity (BMI 35.0-39.9 without comorbidity) Advised to increase physical activity, and to make better food choices,  reassess in 3 months and consider referral to [place] if he continues to gain weight   Izen Petz [facility] 09/04/2016 8:58 AM

## 2016-12-05 ENCOUNTER — Ambulatory Visit: Payer: PRIVATE HEALTH INSURANCE | Admitting: Family Medicine

## 2017-02-04 IMAGING — US US ABDOMEN LIMITED
1 series · 11 of 11 positions shown · non-contrast
Comparison: CT abdomen pelvis 02/05/2016; ultrasound abdomen pelvis
12/24/2014

CLINICAL DATA: Splenomegaly.

EXAM:
LIMITED ABDOMINAL ULTRASOUND

[Series 1: us abdomen limited · 0.28mm/px · 11 of 11 slices shown]
[im 1/11]
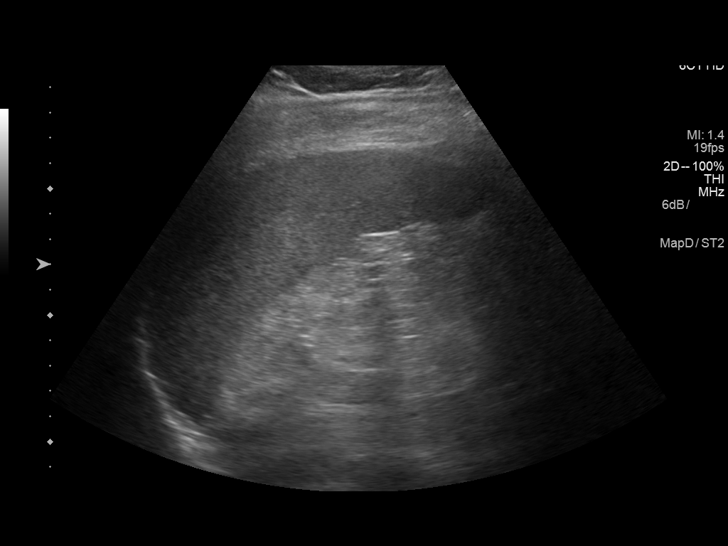
[im 2/11]
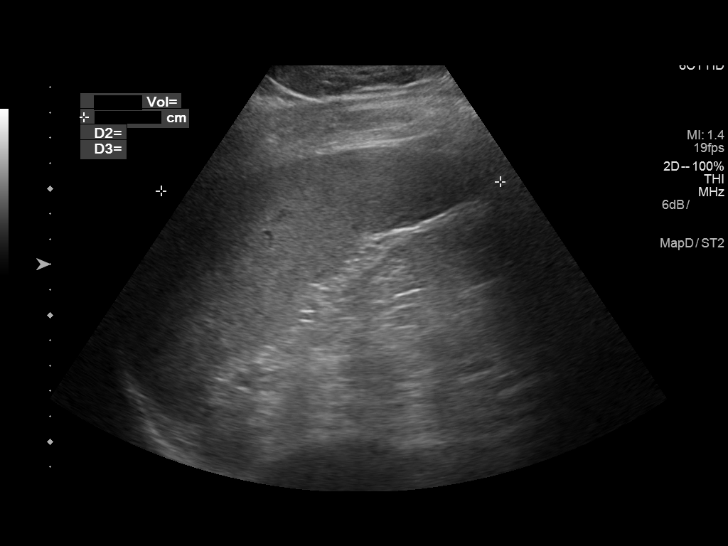
[im 3/11]
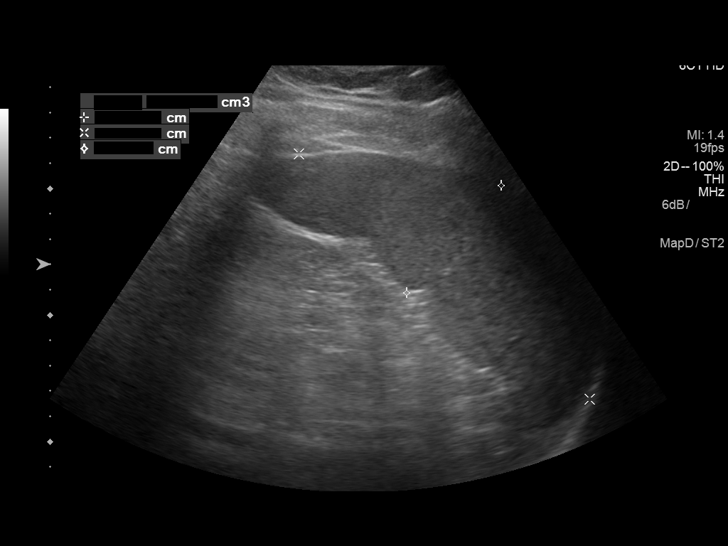
[im 4/11]
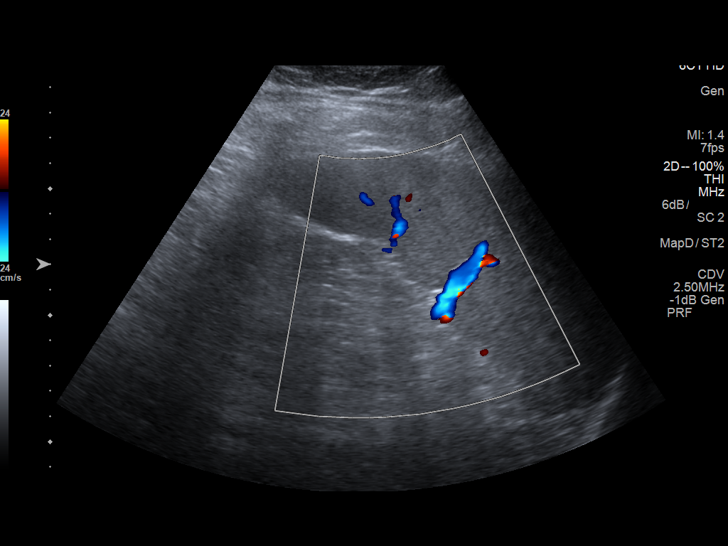
[im 5/11]
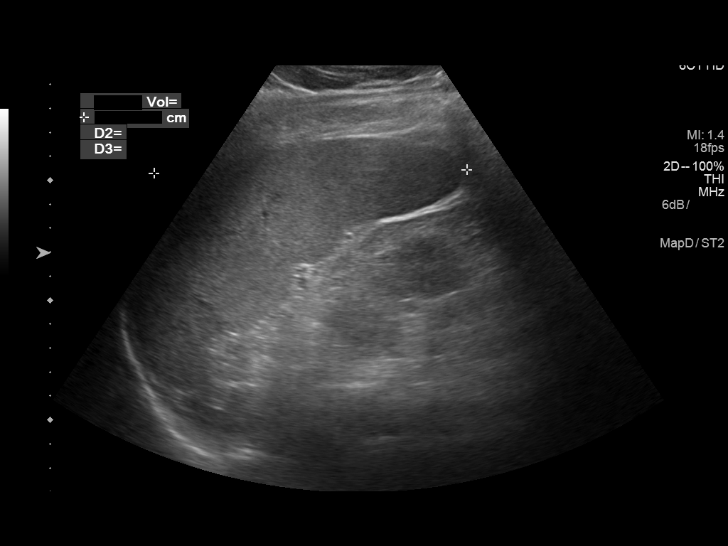
[im 6/11]
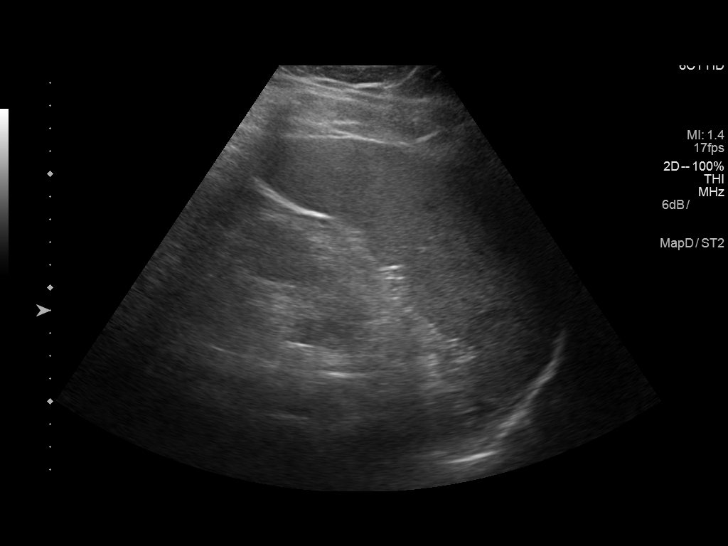
[im 7/11]
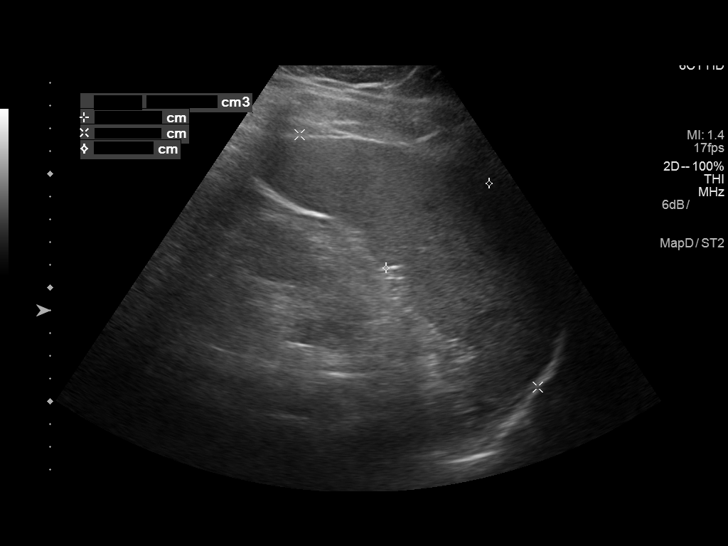
[im 8/11]
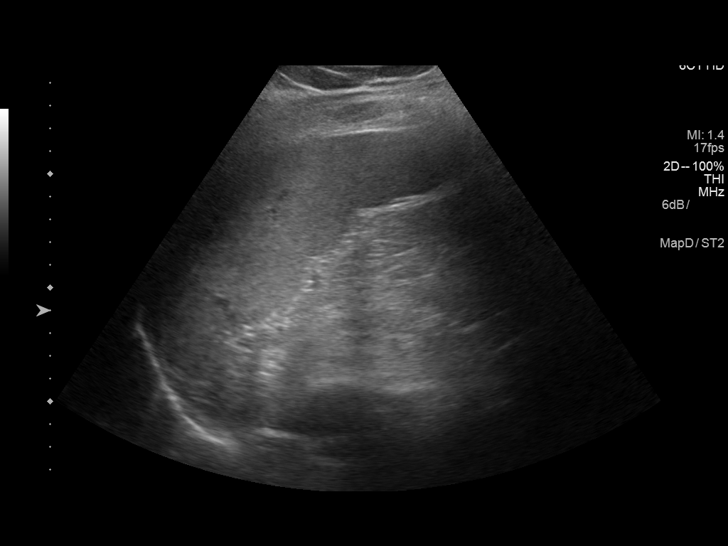
[im 9/11]
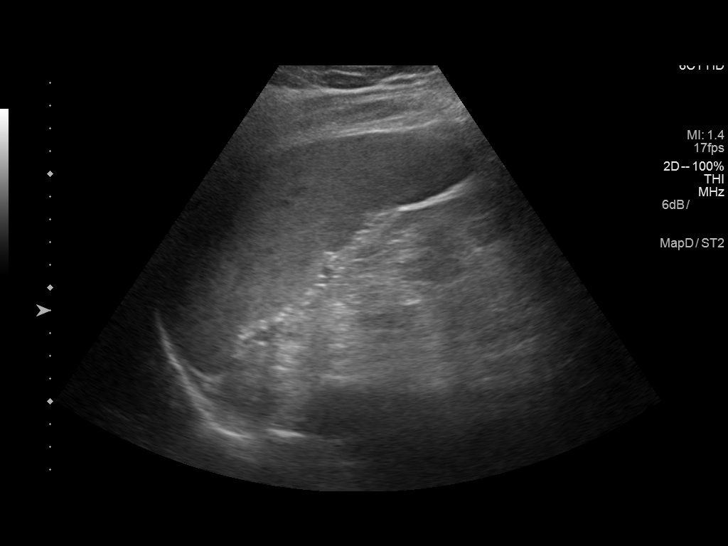
[im 10/11]
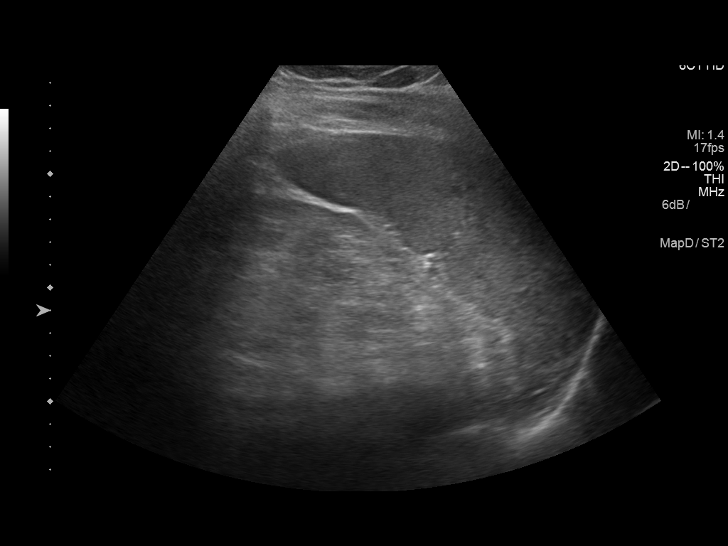
[im 11/11]
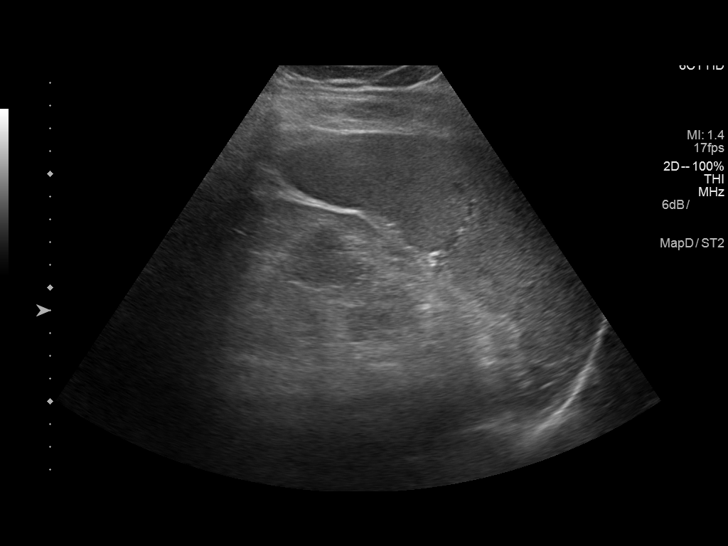

[11 of 11 positions shown; findings below may reference images not displayed]

FINDINGS: The spleen measures 13.1 x 15.3 x 5.9 cm with a splenic volume of
610.6 cc. This is decreased in size from prior ultrasound where it
had a volume of 720 cc however slightly increased from recent prior
CT were it had a volume of 536 cc.
IMPRESSION: Persistent splenomegaly, slightly increased from recent prior CT.
Note these minor changes in size may be secondary to differences in
measurement technique

## 2017-02-27 ENCOUNTER — Telehealth: Payer: Self-pay | Admitting: Family Medicine

## 2017-02-27 NOTE — Telephone Encounter (Signed)
Copied from [place] [id number]. Topic: Appointment Scheduling - Scheduling Inquiry for Clinic >> Feb 27, 2017  9:54 AM Neva Seat wrote: Reason for CRM:   Pt is scheduled for Dec 7 for visit for medication refill.  Would like a sooner Friday appoint. To have refills for medications.

## 2017-02-27 NOTE — Telephone Encounter (Signed)
Spoke with patient and his wife and they stated that they had already made the appt for Dec 7

## 2017-04-06 ENCOUNTER — Encounter: Payer: Self-pay | Admitting: Family Medicine

## 2017-04-06 ENCOUNTER — Ambulatory Visit: Payer: 59 | Admitting: Family Medicine

## 2017-04-06 DIAGNOSIS — F411 Generalized anxiety disorder: Secondary | ICD-10-CM | POA: Diagnosis not present

## 2017-04-06 MED ORDER — PAROXETINE HCL 20 MG PO TABS: 20.0000 mg | ORAL_TABLET | Freq: Two times a day (BID) | ORAL | 1 refills | Status: DC

## 2017-04-06 MED ORDER — ALPRAZOLAM 0.5 MG PO TABS: 0.5000 mg | ORAL_TABLET | Freq: Two times a day (BID) | ORAL | 2 refills | Status: DC | PRN

## 2017-04-06 NOTE — Progress Notes (Signed)
Name: Kenneth Moreno   MRN: [id number]    DOB: 02-15-1971   Date:04/06/2017       Progress Note  Subjective  Chief Complaint  Chief Complaint  Patient presents with  . medication mangenment  . Medication Refill    Anxiety  Presents for follow-up visit. Symptoms include excessive worry, nervous/anxious behavior and panic. Patient reports no depressed mood, insomnia, irritability or muscle tension. Primary symptoms comment: mainly work stress. The severity of symptoms is moderate. The quality of sleep is good.      Past Medical History:  Diagnosis Date  . Airway hyperreactivity   . Anxiety   . Asthma   . Depression   . Hematuria   . HLD (hyperlipidemia)   . Hypertriglyceridemia   . Kidney stone on right side   . Microscopic hematuria   . Obesity   . Post-nasal drainage   . Renal disorder   . Restless leg   . Right ureteral stone   . RUQ pain   . Sleep apnea   . Splenomegaly   . Vertigo, benign positional     Past Surgical History:  Procedure Laterality Date  . ganglion cyst removal  2006  . LITHOTRIPSY    . SINUS SURGERY WITH [facility]  . TONSILLECTOMY  1990    Family History  Problem Relation Age of Onset  . Colon cancer Brother   . Colon cancer Maternal Uncle   . Diabetes Mellitus II Father   . Hypertension Daughter   . Prostate cancer Paternal Uncle   . Kidney disease Neg Hx     Social History   Socioeconomic History  . Marital status: Married    Spouse name: Not on file  . Number of children: Not on file  . Years of education: Not on file  . Highest education level: Not on file  Social Needs  . Financial resource strain: Not on file  . Food insecurity - worry: Not on file  . Food insecurity - inability: Not on file  . Transportation needs - medical: Not on file  . Transportation needs - non-medical: Not on file  Occupational History  . Not on file  Tobacco Use  . Smoking status: Never Smoker  . Smokeless tobacco: Never Used   Substance and Sexual Activity  . Alcohol use: No  . Drug use: No  . Sexual activity: Not on file  Other Topics Concern  . Not on file  Social History Narrative  . Not on file     Current Outpatient Medications:  .  ALPRAZolam (XANAX) 0.5 MG tablet, Take 1 tablet (0.5 mg total) by mouth 2 (two) times daily as needed for anxiety., Disp: 60 tablet, Rfl: 2 .  PARoxetine (PAXIL) 20 MG tablet, Take 1 tablet (20 mg total) by mouth 2 (two) times daily., Disp: 180 tablet, Rfl: 1  Allergies  Allergen Reactions  . Penicillins Diarrhea     Review of Systems  Constitutional: Negative for irritability.  Psychiatric/Behavioral: The patient is nervous/anxious. The patient does not have insomnia.      Objective  Vitals:   04/06/17 1203  BP: 128/86  Pulse: 65  Resp: 14  Temp: 98.4 F (36.9 C)  TempSrc: Oral  SpO2: 97%  Weight: 266 lb 6.4 oz (120.8 kg)    Physical Exam  Constitutional: He is oriented to person, place, and time and well-developed, well-nourished, and in no distress.  HENT:  Head: Normocephalic and atraumatic.  Cardiovascular: Normal rate, regular rhythm and  normal heart sounds.  No murmur heard. Pulmonary/Chest: Effort normal and breath sounds normal. He has no wheezes.  Neurological: He is alert and oriented to person, place, and time.  Psychiatric: Mood, memory, affect and judgment normal.  Nursing note and vitals reviewed.     Assessment & Plan  1. Anxiety, generalized Symptoms stable and responsive to Xanax taken twice daily as needed, Paxil 20 mg taken every day - ALPRAZolam (XANAX) 0.5 MG tablet; Take 1 tablet (0.5 mg total) by mouth 2 (two) times daily as needed for anxiety.  Dispense: 60 tablet; Refill: 2 - PARoxetine (PAXIL) 20 MG tablet; Take 1 tablet (20 mg total) by mouth 2 (two) times daily.  Dispense: 180 tablet; Refill: 1   Drakkar Medeiros Asad A[facility] 04/06/2017 12:05 PM

## 2017-05-08 ENCOUNTER — Encounter: Payer: Self-pay | Admitting: Family Medicine

## 2017-05-08 ENCOUNTER — Ambulatory Visit (INDEPENDENT_AMBULATORY_CARE_PROVIDER_SITE_OTHER): Payer: 59 | Admitting: Family Medicine

## 2017-05-08 VITALS — BP 124/68 | HR 77 | Temp 98.7°F | Resp 14 | Ht 72.5 in | Wt 269.1 lb

## 2017-05-08 DIAGNOSIS — Z23 Encounter for immunization: Secondary | ICD-10-CM | POA: Diagnosis not present

## 2017-05-08 DIAGNOSIS — Z1211 Encounter for screening for malignant neoplasm of colon: Secondary | ICD-10-CM | POA: Diagnosis not present

## 2017-05-08 DIAGNOSIS — Z0000 Encounter for general adult medical examination without abnormal findings: Secondary | ICD-10-CM | POA: Diagnosis not present

## 2017-05-08 NOTE — Progress Notes (Signed)
Name: Kenneth Moreno   MRN: [id number]    DOB: 1971/02/23   Date:05/08/2017       Progress Note  Subjective  Chief Complaint  Chief Complaint  Patient presents with  . Annual Exam    Fasting labs     HPI  Pt. presents for Complete Physical Exam.  He has FHx of Colon Cancer on father's side of the family, his uncle was diagnosed with colon cancer at age 49. He follows up with Urology for prostate exams.    Past Medical History:  Diagnosis Date  . Airway hyperreactivity   . Anxiety   . Asthma   . Depression   . Hematuria   . HLD (hyperlipidemia)   . Hypertriglyceridemia   . Kidney stone on right side   . Microscopic hematuria   . Obesity   . Post-nasal drainage   . Renal disorder   . Restless leg   . Right ureteral stone   . RUQ pain   . Sleep apnea   . Splenomegaly   . Vertigo, benign positional     Past Surgical History:  Procedure Laterality Date  . ganglion cyst removal  2006  . LITHOTRIPSY    . SINUS SURGERY WITH [facility]  . TONSILLECTOMY  1990    Family History  Problem Relation Age of Onset  . Colon cancer Brother   . Colon cancer Maternal Uncle   . Diabetes Mellitus II Father   . Hyperlipidemia Father   . COPD Father   . Hypertension Daughter   . Prostate cancer Paternal Uncle   . Gallbladder disease Mother   . Hearing loss Mother        one of her ears. pt not sure which one  . Panic disorder Sister   . Hyperlipidemia Maternal Grandmother   . Gallbladder disease Maternal Grandmother   . Hyperlipidemia Maternal Grandfather   . Congestive Heart Failure Paternal Grandfather   . Hyperlipidemia Paternal Grandfather   . Diabetes Paternal Grandfather   . Kidney disease Neg Hx     Social History   Socioeconomic History  . Marital status: Married    Spouse name: Not on file  . Number of children: Not on file  . Years of education: Not on file  . Highest education level: Not on file  Social Needs  . Financial resource strain: Not on  file  . Food insecurity - worry: Not on file  . Food insecurity - inability: Not on file  . Transportation needs - medical: Not on file  . Transportation needs - non-medical: Not on file  Occupational History  . Not on file  Tobacco Use  . Smoking status: Never Smoker  . Smokeless tobacco: Never Used  Substance and Sexual Activity  . Alcohol use: No  . Drug use: No  . Sexual activity: Yes  Other Topics Concern  . Not on file  Social History Narrative  . Not on file     Current Outpatient Medications:  .  ALPRAZolam (XANAX) 0.5 MG tablet, Take 1 tablet (0.5 mg total) by mouth 2 (two) times daily as needed for anxiety., Disp: 60 tablet, Rfl: 2 .  PARoxetine (PAXIL) 20 MG tablet, Take 1 tablet (20 mg total) by mouth 2 (two) times daily., Disp: 180 tablet, Rfl: 1  Allergies  Allergen Reactions  . Penicillins Diarrhea     Review of Systems  Constitutional: Negative for chills, fever and malaise/fatigue.  HENT: Negative for congestion, ear pain, sinus pain  and sore throat.   Eyes: Negative for blurred vision and double vision.  Respiratory: Negative for cough, sputum production and shortness of breath.   Cardiovascular: Negative for chest pain, palpitations and leg swelling.  Gastrointestinal: Negative for abdominal pain, blood in stool, constipation, diarrhea, nausea and vomiting.  Genitourinary: Positive for frequency (increased frequency of urination in the past year. ). Negative for dysuria, hematuria and urgency.  Musculoskeletal: Negative for back pain and neck pain.  Skin: Negative for itching and rash.  Neurological: Negative for dizziness and headaches.  Psychiatric/Behavioral: Negative for depression. The patient is nervous/anxious.     Objective  Vitals:   05/08/17 0909  BP: 124/68  Pulse: 77  Resp: 14  Temp: 98.7 F (37.1 C)  TempSrc: Oral  SpO2: 96%  Weight: 269 lb 1.6 oz (122.1 kg)  Height: 6' 0.5" (1.842 m)    Physical Exam  Constitutional: He is  oriented to person, place, and time and well-developed, well-nourished, and in no distress.  HENT:  Head: Normocephalic and atraumatic.  Right Ear: External ear normal.  Left Ear: External ear normal.  Mouth/Throat: Oropharynx is clear and moist.  Eyes: Pupils are equal, round, and reactive to light.  Neck: Neck supple. No thyromegaly present.  Cardiovascular: Normal rate, regular rhythm and normal heart sounds.  No murmur heard. Pulmonary/Chest: Effort normal and breath sounds normal. He has no wheezes.  Abdominal: Soft. Bowel sounds are normal. There is no tenderness.  Musculoskeletal: He exhibits no edema.  Neurological: He is alert and oriented to person, place, and time.  Psychiatric: Mood, memory, affect and judgment normal.  Nursing note and vitals reviewed.     Assessment & Plan  1. Needs flu shot  - Flu Vaccine QUAD 6+ mos PF IM (Fluarix Quad PF)  2. Annual physical exam Obtain age-appropriate laboratory screenings, will include PSA and patient follows up with urology for prostate exam - CBC with Differential/Platelet - COMPLETE METABOLIC PANEL WITH GFR - Lipid panel - TSH - VITAMIN D 25 Hydroxy (Vit-D Deficiency, Fractures) - PSA  3. Screening for colon cancer Based on patient's family history of colon cancer, will provide referral for a screening colonoscopy - Ambulatory referral to Gastroenterology   Dossie Der Asad A. [facility] 05/08/2017 9:25 AM

## 2017-05-09 LAB — COMPLETE METABOLIC PANEL WITH GFR
AG Ratio: 1.8 (calc) (ref 1.0–2.5)
ALBUMIN MSPROF: 4.4 g/dL (ref 3.6–5.1)
ALT: 33 U/L (ref 9–46)
AST: 16 U/L (ref 10–40)
Alkaline phosphatase (APISO): 49 U/L (ref 40–115)
BILIRUBIN TOTAL: 0.5 mg/dL (ref 0.2–1.2)
BUN: 15 mg/dL (ref 7–25)
CHLORIDE: 103 mmol/L (ref 98–110)
CO2: 28 mmol/L (ref 20–32)
CREATININE: 1.02 mg/dL (ref 0.60–1.35)
Calcium: 9.5 mg/dL (ref 8.6–10.3)
GFR, EST AFRICAN AMERICAN: 100 mL/min/{1.73_m2} (ref >=60)
GFR, Est Non African American: 87 mL/min/{1.73_m2} (ref >=60)
Globulin: 2.4 g/dL (calc) (ref 1.9–3.7)
Glucose, Bld: 110 mg/dL — ABNORMAL HIGH (ref 65–99)
Potassium: 4.6 mmol/L (ref 3.5–5.3)
Sodium: 138 mmol/L (ref 135–146)
TOTAL PROTEIN: 6.8 g/dL (ref 6.1–8.1)

## 2017-05-09 LAB — CBC WITH DIFFERENTIAL/PLATELET
BASOS PCT: 0.6 %
Basophils Absolute: 43 cells/uL (ref 0–200)
Eosinophils Absolute: 128 cells/uL (ref 15–500)
Eosinophils Relative: 1.8 %
HCT: 44.9 % (ref 38.5–50.0)
HEMOGLOBIN: 15.5 g/dL (ref 13.2–17.1)
Lymphs Abs: 1981 cells/uL (ref 850–3900)
MCH: 28.3 pg (ref 27.0–33.0)
MCHC: 34.5 g/dL (ref 32.0–36.0)
MCV: 81.9 fL (ref 80.0–100.0)
MONOS PCT: 9.8 %
MPV: 11.1 fL (ref 7.5–12.5)
Neutro Abs: 4253 cells/uL (ref 1500–7800)
Neutrophils Relative %: 59.9 %
Platelets: 221 10*3/uL (ref 140–400)
RBC: 5.48 10*6/uL (ref 4.20–5.80)
RDW: 13.3 % (ref 11.0–15.0)
Total Lymphocyte: 27.9 %
WBC mixed population: 696 cells/uL (ref 200–950)
WBC: 7.1 10*3/uL (ref 3.8–10.8)

## 2017-05-09 LAB — TSH: TSH: 0.87 m[IU]/L (ref 0.40–4.50)

## 2017-05-09 LAB — LIPID PANEL
CHOL/HDL RATIO: 5.4 (calc) — AB (ref <5.0)
Cholesterol: 172 mg/dL (ref <200)
HDL: 32 mg/dL — ABNORMAL LOW (ref >40)
LDL Cholesterol (Calc): 93 mg/dL (calc)
NON-HDL CHOLESTEROL (CALC): 140 mg/dL — AB (ref <130)
Triglycerides: 354 mg/dL — ABNORMAL HIGH (ref <150)

## 2017-05-09 LAB — PSA: PSA: 0.3 ng/mL (ref <=4.0)

## 2017-05-09 LAB — VITAMIN D 25 HYDROXY (VIT D DEFICIENCY, FRACTURES): VIT D 25 HYDROXY: 16 ng/mL — AB (ref 30–100)

## 2017-05-10 IMAGING — CR DG ABDOMEN 1V
1 series · 2 of 2 positions shown · non-contrast
Comparison: Ultrasound 08/12/2015 .  CT 02/05/2015.

CLINICAL DATA: Kidney stones.  Splenomegaly.

EXAM:
ABDOMEN - 1 VIEW

[Series 1: dg abd 1 view · 0.14mm/px · 2 of 2 slices shown]
[im 1/2]
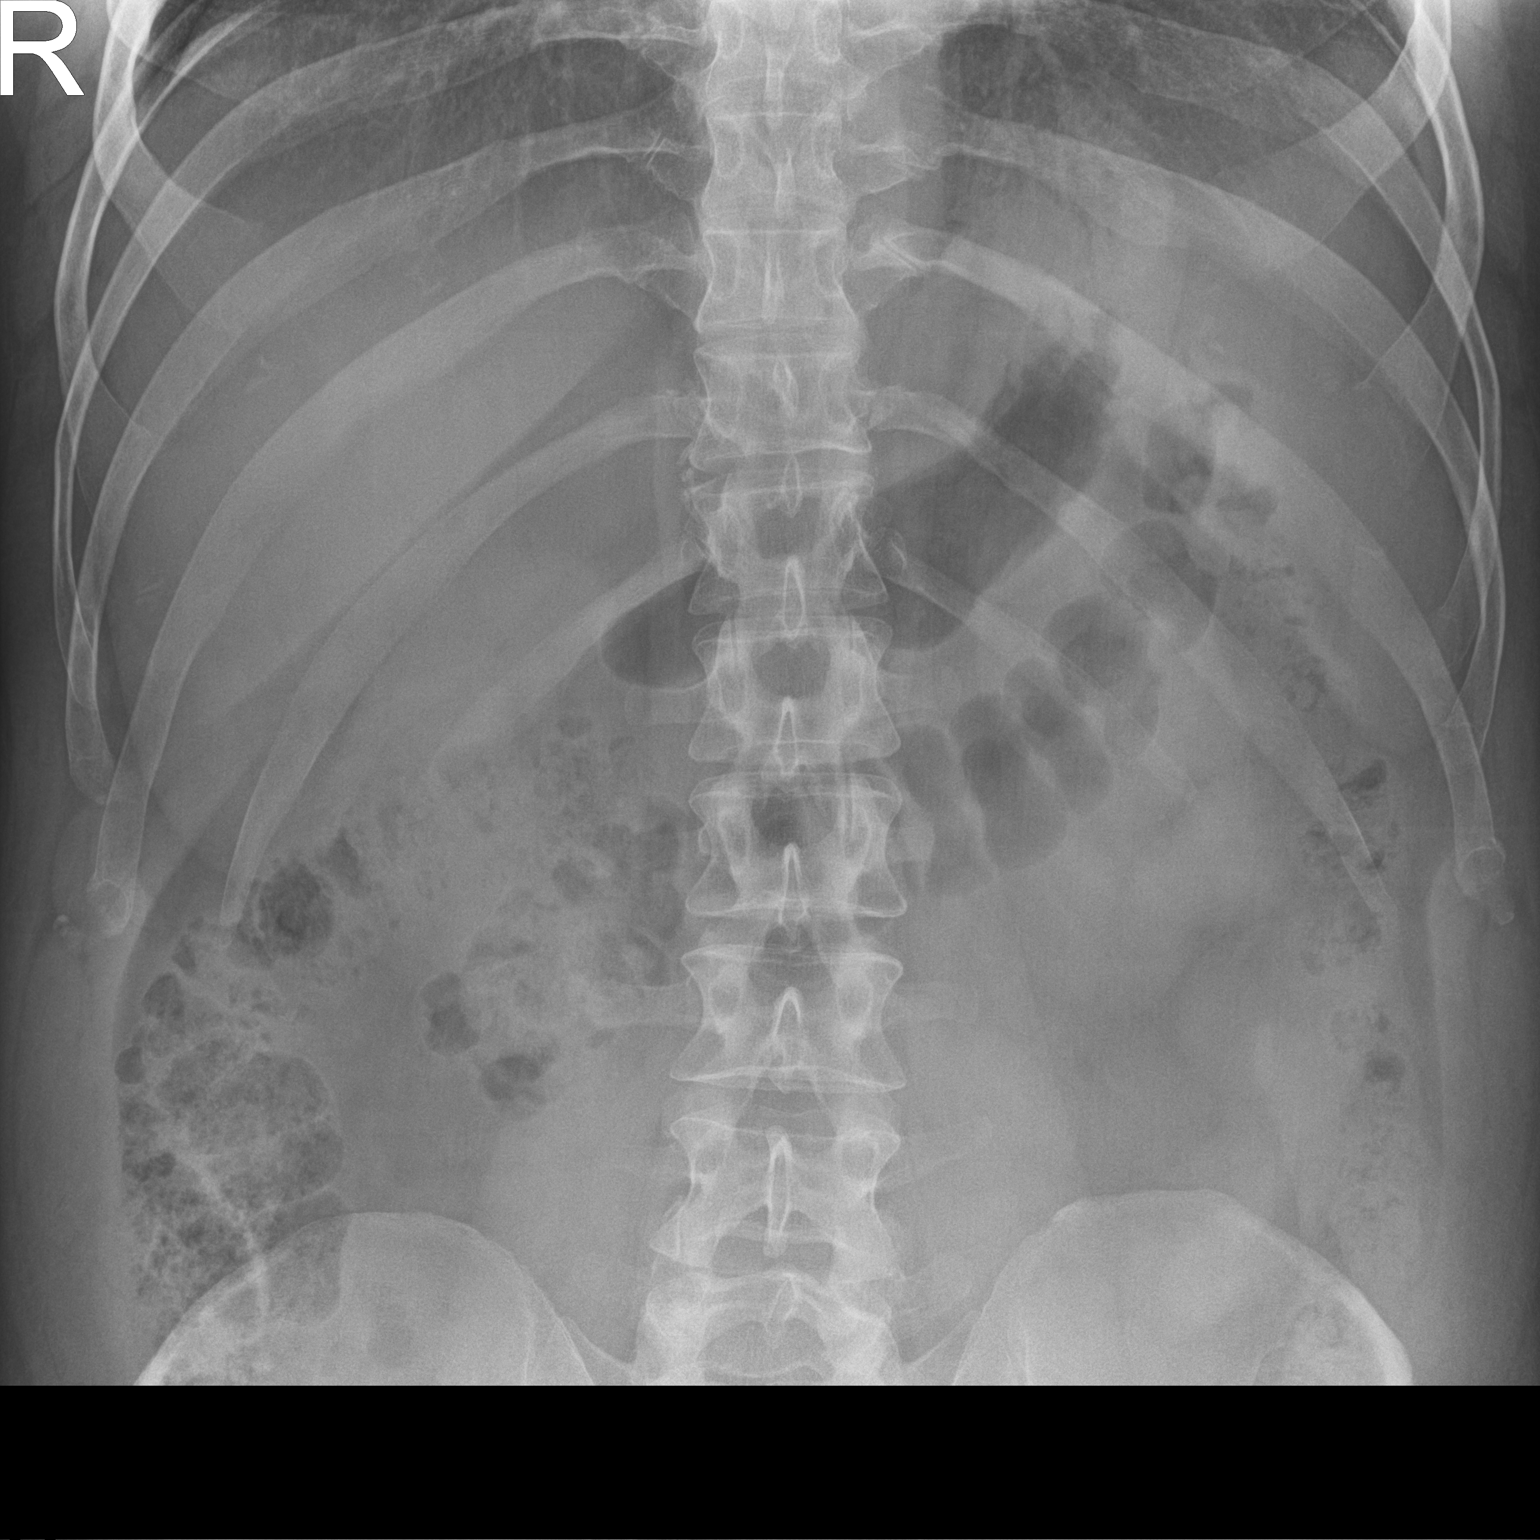
[im 2/2]
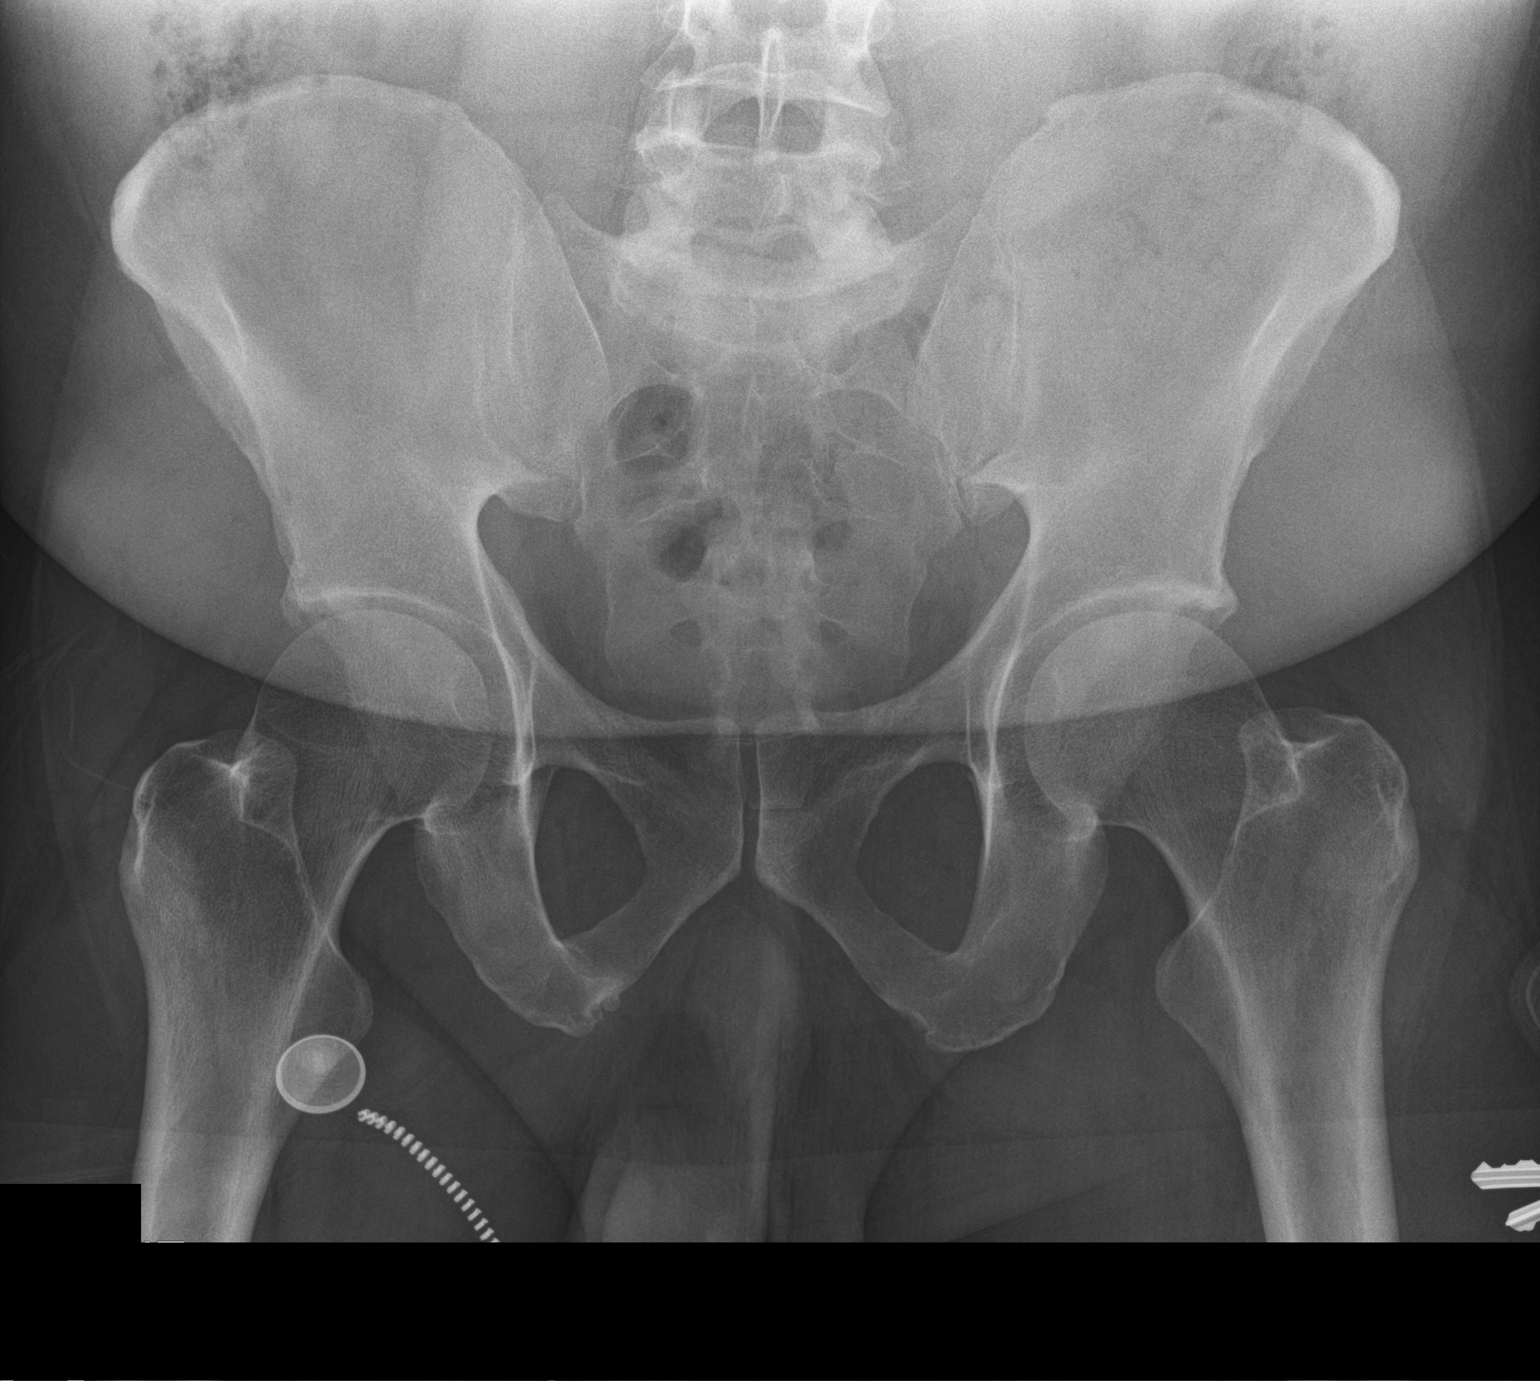

[2 of 2 positions shown; findings below may reference images not displayed]

FINDINGS: Soft tissue structures are unremarkable. No bowel distention. Stool
noted throughout the colon. Tiny left renal calyceal stones cannot
be excluded. No evidence of ureteral stone.
IMPRESSION: 1. No acute abnormality.  Stool noted throughout the colon.

2. Tiny left renal calyceal stones cannot be completely excluded. Or
evidence of ureteral stone.

## 2017-05-14 ENCOUNTER — Other Ambulatory Visit: Payer: Self-pay

## 2017-05-14 DIAGNOSIS — E559 Vitamin D deficiency, unspecified: Secondary | ICD-10-CM

## 2017-05-14 MED ORDER — VITAMIN D (ERGOCALCIFEROL) 1.25 MG (50000 UNIT) PO CAPS
50000.0000 [IU] | ORAL_CAPSULE | ORAL | 0 refills | Status: DC
Start: 2017-05-14 — End: 2018-05-23

## 2017-06-12 ENCOUNTER — Other Ambulatory Visit: Payer: Self-pay

## 2017-07-02 ENCOUNTER — Encounter: Payer: Self-pay | Admitting: Family Medicine

## 2017-07-02 ENCOUNTER — Ambulatory Visit (INDEPENDENT_AMBULATORY_CARE_PROVIDER_SITE_OTHER): Payer: 59 | Admitting: Family Medicine

## 2017-07-02 VITALS — BP 126/84 | HR 84 | Temp 98.3°F | Ht 72.5 in | Wt 272.4 lb

## 2017-07-02 DIAGNOSIS — F419 Anxiety disorder, unspecified: Secondary | ICD-10-CM | POA: Insufficient documentation

## 2017-07-02 DIAGNOSIS — F32A Depression, unspecified: Secondary | ICD-10-CM

## 2017-07-02 DIAGNOSIS — R739 Hyperglycemia, unspecified: Secondary | ICD-10-CM

## 2017-07-02 DIAGNOSIS — E669 Obesity, unspecified: Secondary | ICD-10-CM | POA: Diagnosis not present

## 2017-07-02 DIAGNOSIS — R635 Abnormal weight gain: Secondary | ICD-10-CM

## 2017-07-02 DIAGNOSIS — R61 Generalized hyperhidrosis: Secondary | ICD-10-CM | POA: Diagnosis not present

## 2017-07-02 DIAGNOSIS — F329 Major depressive disorder, single episode, unspecified: Secondary | ICD-10-CM

## 2017-07-02 DIAGNOSIS — Z1389 Encounter for screening for other disorder: Secondary | ICD-10-CM | POA: Insufficient documentation

## 2017-07-02 DIAGNOSIS — F411 Generalized anxiety disorder: Secondary | ICD-10-CM | POA: Diagnosis not present

## 2017-07-02 DIAGNOSIS — Z0000 Encounter for general adult medical examination without abnormal findings: Secondary | ICD-10-CM | POA: Insufficient documentation

## 2017-07-02 DIAGNOSIS — R161 Splenomegaly, not elsewhere classified: Secondary | ICD-10-CM

## 2017-07-02 DIAGNOSIS — R0982 Postnasal drip: Secondary | ICD-10-CM | POA: Insufficient documentation

## 2017-07-02 DIAGNOSIS — G4730 Sleep apnea, unspecified: Secondary | ICD-10-CM | POA: Insufficient documentation

## 2017-07-02 DIAGNOSIS — H8110 Benign paroxysmal vertigo, unspecified ear: Secondary | ICD-10-CM | POA: Insufficient documentation

## 2017-07-02 DIAGNOSIS — R319 Hematuria, unspecified: Secondary | ICD-10-CM | POA: Insufficient documentation

## 2017-07-02 DIAGNOSIS — E781 Pure hyperglyceridemia: Secondary | ICD-10-CM | POA: Diagnosis not present

## 2017-07-02 DIAGNOSIS — J4520 Mild intermittent asthma, uncomplicated: Secondary | ICD-10-CM

## 2017-07-02 DIAGNOSIS — G2581 Restless legs syndrome: Secondary | ICD-10-CM | POA: Insufficient documentation

## 2017-07-02 MED ORDER — OMEGA-3-ACID ETHYL ESTERS 1 G PO CAPS: 2.0000 g | ORAL_CAPSULE | Freq: Two times a day (BID) | ORAL | 5 refills | Status: DC

## 2017-07-02 NOTE — Assessment & Plan Note (Signed)
Encouraged weight loss 

## 2017-07-02 NOTE — Assessment & Plan Note (Signed)
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

## 2017-07-02 NOTE — Patient Instructions (Addendum)
Check out the information at familydoctor.org entitled "Nutrition for Weight Loss: What You Need to Know about Fad Diets" Try to lose between 1-2 pounds per week by taking in fewer calories and burning off more calories You can succeed by limiting portions, limiting foods dense in calories and fat, becoming more active, and drinking 8 glasses of water a day (64 ounces) Don't skip meals, especially breakfast, as skipping meals may alter your metabolism Do not use over-the-counter weight loss pills or gimmicks that claim rapid weight loss A healthy BMI (or body mass index) is between 18.5 and 24.9 You can calculate your ideal BMI at the [place] website [employer]fr Please call Dr. Aletha Halim office about your spleen and possible lymphoma question  Kenneth Sickle, MD Consulting Physician Internal Medicine 07/02/2017 End  07/02/17  Phone: [phone]; Fax: [phone]      12 Ways to [place]  ?Anxiety is normal human sensation. It is what helped our ancestors survive the pitfalls of the wilderness. Anxiety is defined as experiencing worry or nervousness about an imminent event or something with an uncertain outcome. It is a feeling experienced by most people at some point in their lives. Anxiety can be triggered by a very personal issue, such as the illness of a loved one, or an event of global proportions, such as a refugee crisis. Some of the symptoms of anxiety are:  Feeling restless.  Having a feeling of impending danger.  Increased heart rate.  Rapid breathing. Sweating.  Shaking.  Weakness or feeling tired.  Difficulty concentrating on anything except the current worry.  Insomnia.  Stomach or bowel problems. What can we do about anxiety we may be feeling? There are many techniques to help manage stress and relax. Here are 12 ways you can reduce your anxiety almost immediately: 1. Turn off the constant feed of information.  Take a social media sabbatical. Studies have shown that social media directly contributes to social anxiety.  2. Monitor your television viewing habits. Are you watching shows that are also contributing to your anxiety, such as 24-hour news stations? Try watching something else, or better yet, nothing at all. Instead, listen to music, read an inspirational book or practice a hobby. 3. Eat nutritious meals. Also, don't skip meals and keep healthful snacks on hand. Hunger and poor diet contributes to feeling anxious. 4. Sleep. Sleeping on a regular schedule for at least seven to eight hours a night will do wonders for your outlook when you are awake. 5. Exercise. Regular exercise will help rid your body of that anxious energy and help you get more restful sleep. 6. Try deep (diaphragmatic) breathing. Inhale slowly through your nose for five seconds and exhale through your mouth. 7. Practice acceptance and gratitude. When anxiety hits, accept that there are things out of your control that shouldn't be of immediate concern.  8. Seek out humor. When anxiety strikes, watch a funny video, read jokes or call a friend who makes you laugh. Laughter is healing for our bodies and releases endorphins that are calming. 9. Stay positive. Take the effort to replace negative thoughts with positive ones. Try to see a stressful situation in a positive light. Try to come up with solutions rather than dwelling on the problem. 10. Figure out what triggers your anxiety. Keep a journal and make note of anxious moments and the events surrounding them. This will help you identify triggers you can avoid or even eliminate. 11. Talk to someone. Let a trusted friend, family member  or even trained professional know that you are feeling overwhelmed and anxious. Verbalize what you are feeling and why.  12. Volunteer. If your anxiety is triggered by a crisis on a large scale, become an advocate and work to resolve the problem that is  causing you unease. Anxiety is often unwelcome and can become overwhelming. If not kept in check, it can become a disorder that could require medical treatment. However, if you take the time to care for yourself and avoid the triggers that make you anxious, you will be able to find moments of relaxation and clarity that make your life much more enjoyable.   Obesity, Adult Obesity is having too much body fat. If you have a BMI of 30 or more, you are obese. BMI is a number that explains how much body fat you have. Obesity is often caused by taking in (consuming) more calories than your body uses. Obesity can cause serious health problems. Changing your lifestyle can help to treat obesity. Follow these instructions at home: Eating and drinking   Follow advice from your doctor about what to eat and drink. Your doctor may tell you to: ? Cut down on (limit) fast foods, sweets, and processed snack foods. ? Choose low-fat options. For example, choose low-fat milk instead of whole milk. ? Eat 5 or more servings of fruits or vegetables every day. ? Eat at home more often. This gives you more control over what you eat. ? Choose healthy foods when you eat out. ? Learn what a healthy portion size is. A portion size is the amount of a certain food that is healthy for you to eat at one time. This is different for each person. ? Keep low-fat snacks available. ? Avoid sugary drinks. These include soda, fruit juice, iced tea that is sweetened with sugar, and flavored milk. ? Eat a healthy breakfast.  Drink enough water to keep your pee (urine) clear or pale yellow.  Do not go without eating for long periods of time (do not fast).  Do not go on popular or trendy diets (fad diets). Physical Activity  Exercise often, as told by your doctor. Ask your doctor: ? What types of exercise are safe for you. ? How often you should exercise.  Warm up and stretch before being active.  Do slow stretching after  being active (cool down).  Rest between times of being active. Lifestyle  Limit how much time you spend in front of your TV, computer, or video game system (be less sedentary).  Find ways to reward yourself that do not involve food.  Limit alcohol intake to no more than 1 drink a day for nonpregnant women and 2 drinks a day for men. One drink equals 12 oz of beer, 5 oz of wine, or 1 oz of hard liquor. General instructions  Keep a weight loss journal. This can help you keep track of: ? The food that you eat. ? The exercise that you do.  Take over-the-counter and prescription medicines only as told by your doctor.  Take vitamins and supplements only as told by your doctor.  Think about joining a support group. Your doctor may be able to help with this.  Keep all follow-up visits as told by your doctor. This is important. Contact a doctor if:  You cannot meet your weight loss goal after you have changed your diet and lifestyle for 6 weeks. This information is not intended to replace advice given to you by your health care  provider. Make sure you discuss any questions you have with your health care provider. Document Released: 07/10/2011 Document Revised: 09/23/2015 Document Reviewed: 02/03/2015 Elsevier Interactive Patient Education  2018 [place] After being diagnosed with an anxiety disorder, you may be relieved to know why you have felt or behaved a certain way. It is natural to also feel overwhelmed about the treatment ahead and what it will mean for your life. With care and support, you can manage this condition and recover from it. How to cope with anxiety Dealing with stress Stress is your body's reaction to life changes and events, both good and bad. Stress can last just a few hours or it can be ongoing. Stress can play a major role in anxiety, so it is important to learn both how to cope with stress and how to think about it differently. Talk with  your health care provider or a counselor to learn more about stress reduction. He or she may suggest some stress reduction techniques, such as:  Music therapy. This can include creating or listening to music that you enjoy and that inspires you.  Mindfulness-based meditation. This involves being aware of your normal breaths, rather than trying to control your breathing. It can be done while sitting or walking.  Centering prayer. This is a kind of meditation that involves focusing on a word, phrase, or sacred image that is meaningful to you and that brings you peace.  Deep breathing. To do this, expand your stomach and inhale slowly through your nose. Hold your breath for 3-5 seconds. Then exhale slowly, allowing your stomach muscles to relax.  Self-talk. This is a skill where you identify thought patterns that lead to anxiety reactions and correct those thoughts.  Muscle relaxation. This involves tensing muscles then relaxing them.  Choose a stress reduction technique that fits your lifestyle and personality. Stress reduction techniques take time and practice. Set aside 5-15 minutes a day to do them. Therapists can offer training in these techniques. The training may be covered by some insurance plans. Other things you can do to manage stress include:  Keeping a stress diary. This can help you learn what triggers your stress and ways to control your response.  Thinking about how you respond to certain situations. You may not be able to control everything, but you can control your reaction.  Making time for activities that help you relax, and not feeling guilty about spending your time in this way.  Therapy combined with coping and stress-reduction skills provides the best chance for successful treatment. Medicines Medicines can help ease symptoms. Medicines for anxiety include:  Anti-anxiety drugs.  Antidepressants.  Beta-blockers.  Medicines may be used as the main treatment for  anxiety disorder, along with therapy, or if other treatments are not working. Medicines should be prescribed by a health care provider. Relationships Relationships can play a big part in helping you recover. Try to spend more time connecting with trusted friends and family members. Consider going to couples counseling, taking family education classes, or going to family therapy. Therapy can help you and others better understand the condition. How to recognize changes in your condition Everyone has a different response to treatment for anxiety. Recovery from anxiety happens when symptoms decrease and stop interfering with your daily activities at home or work. This may mean that you will start to:  Have better concentration and focus.  Sleep better.  Be less irritable.  Have more energy.  Have improved memory.  It is important to recognize when your condition is getting worse. Contact your health care provider if your symptoms interfere with home or work and you do not feel like your condition is improving. Where to find help and support: You can get help and support from these sources:  Self-help groups.  Online and [employer].  A trusted spiritual leader.  Couples counseling.  Family education classes.  Family therapy.  Follow these instructions at home:  Eat a healthy diet that includes plenty of vegetables, fruits, whole grains, low-fat dairy products, and lean protein. Do not eat a lot of foods that are high in solid fats, added sugars, or salt.  Exercise. Most adults should do the following: ? Exercise for at least 150 minutes each week. The exercise should increase your heart rate and make you sweat (moderate-intensity exercise). ? Strengthening exercises at least twice a week.  Cut down on caffeine, tobacco, alcohol, and other potentially harmful substances.  Get the right amount and quality of sleep. Most adults need 7-9 hours of sleep each  night.  Make choices that simplify your life.  Take over-the-counter and prescription medicines only as told by your health care provider.  Avoid caffeine, alcohol, and certain over-the-counter cold medicines. These may make you feel worse. Ask your pharmacist which medicines to avoid.  Keep all follow-up visits as told by your health care provider. This is important. Questions to ask your health care provider  Would I benefit from therapy?  How often should I follow up with a health care provider?  How long do I need to take medicine?  Are there any long-term side effects of my medicine?  Are there any alternatives to taking medicine? Contact a health care provider if:  You have a hard time staying focused or finishing daily tasks.  You spend many hours a day feeling worried about everyday life.  You become exhausted by worry.  You start to have headaches, feel tense, or have nausea.  You urinate more than normal.  You have diarrhea. Get help right away if:  You have a racing heart and shortness of breath.  You have thoughts of hurting yourself or others. If you ever feel like you may hurt yourself or others, or have thoughts about taking your own life, get help right away. You can go to your nearest emergency department or call:  Your local emergency services (911 in the [place]  A suicide crisis helpline, such as the [place] at 918 225 0150. This is open 24-hours a day.  Summary  Taking steps to deal with stress can help calm you.  Medicines cannot cure anxiety disorders, but they can help ease symptoms.  Family, friends, and partners can play a big part in helping you recover from an anxiety disorder. This information is not intended to replace advice given to you by your health care provider. Make sure you discuss any questions you have with your health care provider. Document Released: 04/11/2016 Document Revised: 04/11/2016  Document Reviewed: 04/11/2016 Elsevier Interactive Patient Education  [employer].

## 2017-07-02 NOTE — Assessment & Plan Note (Signed)
Urged patient to contact 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 to schedule an appointment for f/u; has not been seen since April 2017

## 2017-07-02 NOTE — Assessment & Plan Note (Signed)
Check A1c with next check of the fasting cholesterol

## 2017-07-02 NOTE — Assessment & Plan Note (Signed)
Start back on lovaza; weight loss, see AVS

## 2017-07-02 NOTE — Assessment & Plan Note (Signed)
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

## 2017-07-02 NOTE — Progress Notes (Signed)
BP 126/84 (BP Location: Right Arm, Patient Position: Sitting, Cuff Size: Large)   Pulse 84   Temp 98.3 F (36.8 C) (Oral)   Ht 6' 0.5" (1.842 m)   Wt 272 lb 6.4 oz (123.6 kg)   SpO2 97%   BMI 36.44 kg/m    Subjective:    Patient ID: Kenneth Moreno, male    DOB: 04/29/1971, 47 y.o.   MRN: [id number]  HPI: Kenneth Moreno is a 47 y.o. male  Chief Complaint  Patient presents with  . Form Completion    Needs letter about medications, DOT was denied     HPI Patient is here as a new patient to me He says that his DOT was denied and needs a letter about medicine If he is taking anything for anxiety, that he can't work On paxil since age 61 or so He just got a prescription of the Xanax last month, but he takes it just as needed; he takes only 3 pills per month now High levels of stress and no sleep Discussed some of the coping strategies that he has talked to Dr. Manuella Ghazi about  High cholesterol; he did get those results; TG were very high; they have been high before; he used to take cholesterol medicine for a while, but did bette and quit taking it; he thinks it is "just eating"; he used to take something Lab Results  Component Value Date   CHOL 172 05/08/2017   CHOL 176 04/03/2016   CHOL 170 07/26/2015   Lab Results  Component Value Date   HDL 32 (L) 05/08/2017   HDL 25 (L) 04/03/2016   HDL 27 (L) 07/26/2015   Lab Results  Component Value Date   LDLCALC NOT CALC 04/03/2016   [place] Comment 07/26/2015   [place] Comment 04/27/2015   Lab Results  Component Value Date   TRIG 354 (H) 05/08/2017   TRIG 712 (H) 04/03/2016   TRIG 520 (H) 07/26/2015   Lab Results  Component Value Date   CHOLHDL 5.4 (H) 05/08/2017   CHOLHDL 7.0 (H) 04/03/2016   CHOLHDL 6.3 (H) 07/26/2015   No results found for: LDLDIRECT  Asthma; used to be on multiple inhalers; he got off of those medicines; 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 thought he needed a PET scan, but insurance denies; he does CT scans or MRI scans; he has not seen him since April 2017; he is having night sweats; early satiety  Depression screen [facility] 2/9 07/02/2017 04/06/2017 09/04/2016 04/03/2016 07/26/2015  Decreased Interest 0 0 0 0 0  Down, Depressed, Hopeless 0 0 0 0 0  PHQ - 2 Score 0 0 0 0 0    Relevant past medical, surgical, family and social history reviewed Past Medical History:  Diagnosis Date  . Airway hyperreactivity   . Anxiety   . Asthma   . Depression   . Hematuria   . HLD (hyperlipidemia)   . Hypertriglyceridemia   . Kidney stone on right side   . Microscopic hematuria   .  Obesity   . Post-nasal drainage   . Renal disorder   . Restless leg   . Right ureteral stone   . RUQ pain   . Sleep apnea   . Splenomegaly   . Vertigo, benign positional    Past Surgical History:  Procedure Laterality Date  . ganglion cyst removal  2006  . LITHOTRIPSY    . SINUS SURGERY WITH [place]  . TONSILLECTOMY  1990   Family History  Problem Relation Age of Onset  . Colon cancer Brother   . Colon cancer Maternal Uncle   . Diabetes Mellitus II Father   . Hyperlipidemia Father   . COPD Father   . Hypertension Daughter   . Prostate cancer Paternal Uncle   . Gallbladder disease Mother   . Hearing loss Mother        one of her ears. pt not sure which one  . Panic disorder Sister   . Hyperlipidemia Maternal Grandmother   . Gallbladder disease Maternal Grandmother   . Hyperlipidemia Maternal Grandfather   . Congestive Heart Failure Paternal Grandfather   . Hyperlipidemia  Paternal Grandfather   . Diabetes Paternal Grandfather   . Kidney disease Neg Hx    Social History   Tobacco Use  . Smoking status: Never Smoker  . Smokeless tobacco: Never Used  Substance Use Topics  . Alcohol use: No  . Drug use: No    Interim medical history since last visit reviewed. Allergies and medications reviewed  Review of Systems Per HPI unless specifically indicated above     Objective:    BP 126/84 (BP Location: Right Arm, Patient Position: Sitting, Cuff Size: Large)   Pulse 84   Temp 98.3 F (36.8 C) (Oral)   Ht 6' 0.5" (1.842 m)   Wt 272 lb 6.4 oz (123.6 kg)   SpO2 97%   BMI 36.44 kg/m   Wt Readings from Last 3 Encounters:  07/02/17 272 lb 6.4 oz (123.6 kg)  05/08/17 269 lb 1.6 oz (122.1 kg)  04/06/17 266 lb 6.4 oz (120.8 kg)    Physical Exam  Constitutional: He appears well-developed and well-nourished. No distress.  HENT:  Head: Normocephalic and atraumatic.  Eyes: EOM are normal. No scleral icterus.  Neck: No thyromegaly present.  Cardiovascular: Normal rate and regular rhythm.  Pulmonary/Chest: Effort normal and breath sounds normal.  Abdominal: Soft. Bowel sounds are normal. He exhibits distension (fullness over the LUQ). There is hepatosplenomegaly. There is tenderness in the right upper quadrant and left upper quadrant. There is no guarding.  Musculoskeletal: He exhibits no edema.  Neurological: Coordination normal.  Skin: Skin is warm and dry. No pallor.  Psychiatric: He has a normal mood and affect. His behavior is normal. Judgment and thought content normal.    Results for orders placed or performed in visit on 05/08/17  CBC with Differential/Platelet  Result Value Ref Range   WBC 7.1 3.8 - 10.8 Thousand/uL   RBC 5.48 4.20 - 5.80 Million/uL   Hemoglobin 15.5 13.2 - 17.1 g/dL   HCT 44.9 38.5 - 50.0 %   MCV 81.9 80.0 - 100.0 fL   MCH 28.3 27.0 - 33.0 pg   MCHC 34.5 32.0 - 36.0 g/dL   RDW 13.3 11.0 - 15.0 %   Platelets 221 140 - 400  Thousand/uL   MPV 11.1 7.5 - 12.5 fL   Neutro Abs 4,253 1,500 - 7,800 cells/uL   Lymphs Abs 1,981 850 - 3,900 cells/uL   WBC mixed  population 696 200 - 950 cells/uL   Eosinophils Absolute 128 15 - 500 cells/uL   Basophils Absolute 43 0 - 200 cells/uL   Neutrophils Relative % 59.9 %   Total Lymphocyte 27.9 %   Monocytes Relative 9.8 %   Eosinophils Relative 1.8 %   Basophils Relative 0.6 %  COMPLETE METABOLIC PANEL WITH GFR  Result Value Ref Range   Glucose, Bld 110 (H) 65 - 99 mg/dL   BUN 15 7 - 25 mg/dL   Creat 1.02 0.60 - 1.35 mg/dL   GFR, Est Non African American 87 > OR = 60 mL/min/1.3m2   GFR, Est African American 100 > OR = 60 mL/min/1.74m2   BUN/Creatinine Ratio NOT APPLICABLE 6 - 22 (calc)   Sodium 138 135 - 146 mmol/L   Potassium 4.6 3.5 - 5.3 mmol/L   Chloride 103 98 - 110 mmol/L   CO2 28 20 - 32 mmol/L   Calcium 9.5 8.6 - 10.3 mg/dL   Total Protein 6.8 6.1 - 8.1 g/dL   Albumin 4.4 3.6 - 5.1 g/dL   Globulin 2.4 1.9 - 3.7 g/dL (calc)   AG Ratio 1.8 1.0 - 2.5 (calc)   Total Bilirubin 0.5 0.2 - 1.2 mg/dL   Alkaline phosphatase (APISO) 49 40 - 115 U/L   AST 16 10 - 40 U/L   ALT 33 9 - 46 U/L  Lipid panel  Result Value Ref Range   Cholesterol 172 <200 mg/dL   HDL 32 (L) >40 mg/dL   Triglycerides 354 (H) <150 mg/dL   LDL Cholesterol (Calc) 93 mg/dL (calc)   Total CHOL/HDL Ratio 5.4 (H) <5.0 (calc)   Non-HDL Cholesterol (Calc) 140 (H) <130 mg/dL (calc)  TSH  Result Value Ref Range   TSH 0.87 0.40 - 4.50 mIU/L  VITAMIN D 25 Hydroxy (Vit-D Deficiency, Fractures)  Result Value Ref Range   Vit D, 25-Hydroxy 16 (L) 30 - 100 ng/mL  PSA  Result Value Ref Range   PSA 0.3 < OR = 4.0 ng/mL      Assessment & Plan:   Problem List Items Addressed This Visit      Respiratory   Airway hyperreactivity    Much improved        Other   Splenomegaly    Urged patient to contact 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 to schedule an appointment for f/u; has not been seen since April 2017       Relevant Orders   COMPLETE METABOLIC PANEL WITH GFR   Obesity (BMI 35.0-39.9 without comorbidity)    Encouraged weight loss      Hypertriglyceridemia (Chronic)    Start back on lovaza; weight loss, see AVS      Relevant Medications   omega-3 acid ethyl esters (LOVAZA) 1 g capsule   Other Relevant Orders   Lipid panel   Hyperglycemia (Chronic)    Check A1c with next check of the fasting cholesterol      Relevant Orders   Hemoglobin A1c   Clinical depression    Patient needs evaluation for DOT approval; I can continue the SSRI; I will not prescribe benzo and recommend he stop, unless the psychiatrist believes this is recommended (but I will not prescribe)      Relevant Orders   Ambulatory referral to Psychiatry   Anxiety, generalized - Primary    Explained that we can continue the SSRI but we will not be able to provide any additional refills of the benzo; he reports only taking 3 pills per month;  refer to psychiatrist for the DOT issue and forms for treatment and management of his anxiety and depression      Relevant Orders   Ambulatory referral to Psychiatry    Other Visit Diagnoses    Weight gain       Relevant Orders   TSH   Night sweats       concerning with the splenomegaly; we urged patient to see his oncologist       Follow up plan: Return in about 12 weeks (around 09/24/2017) for twenty minute follow-up with fasting labs for cholesterol, obesity, high sugar; appointment 2 days.  An after-visit summary was printed and given to the patient at [facility].  Please see the patient instructions which may contain other information and recommendations beyond what is mentioned above in the assessment and plan.  Meds ordered this encounter  Medications  . omega-3 acid ethyl esters (LOVAZA) 1 g capsule    Sig: Take 2 capsules (2 g total) by mouth 2 (two) times daily.    Dispense:  120 capsule    Refill:  5    Orders Placed This Encounter  Procedures  . Lipid panel    . TSH  . Hemoglobin A1c  . COMPLETE METABOLIC PANEL WITH GFR  . Ambulatory referral to Psychiatry

## 2017-07-02 NOTE — Assessment & Plan Note (Signed)
Much improved

## 2017-07-03 ENCOUNTER — Ambulatory Visit: Payer: 59 | Admitting: Family Medicine

## 2017-07-06 ENCOUNTER — Encounter: Payer: Self-pay | Admitting: Internal Medicine

## 2017-07-06 ENCOUNTER — Inpatient Hospital Stay: Payer: 59

## 2017-07-06 ENCOUNTER — Inpatient Hospital Stay: Payer: 59 | Attending: Internal Medicine | Admitting: Internal Medicine

## 2017-07-06 VITALS — BP 142/98 | HR 71 | Temp 98.3°F | Resp 18 | Ht 72.0 in | Wt 269.0 lb

## 2017-07-06 DIAGNOSIS — R6881 Early satiety: Secondary | ICD-10-CM | POA: Diagnosis not present

## 2017-07-06 DIAGNOSIS — R161 Splenomegaly, not elsewhere classified: Secondary | ICD-10-CM

## 2017-07-06 DIAGNOSIS — R61 Generalized hyperhidrosis: Secondary | ICD-10-CM | POA: Diagnosis not present

## 2017-07-06 DIAGNOSIS — R197 Diarrhea, unspecified: Secondary | ICD-10-CM | POA: Diagnosis not present

## 2017-07-06 LAB — CBC WITH DIFFERENTIAL/PLATELET
BASOS ABS: 0.1 10*3/uL (ref 0–0.1)
BASOS PCT: 1 %
EOS ABS: 0.2 10*3/uL (ref 0–0.7)
EOS PCT: 2 %
HEMATOCRIT: 45.5 % (ref 40.0–52.0)
Hemoglobin: 15.9 g/dL (ref 13.0–18.0)
Lymphocytes Relative: 31 %
Lymphs Abs: 2.2 10*3/uL (ref 1.0–3.6)
MCH: 28.8 pg (ref 26.0–34.0)
MCHC: 34.9 g/dL (ref 32.0–36.0)
MCV: 82.3 fL (ref 80.0–100.0)
MONO ABS: 0.7 10*3/uL (ref 0.2–1.0)
Monocytes Relative: 9 %
NEUTROS ABS: 4.1 10*3/uL (ref 1.4–6.5)
Neutrophils Relative %: 57 %
PLATELETS: 241 10*3/uL (ref 150–440)
RBC: 5.52 MIL/uL (ref 4.40–5.90)
RDW: 14.3 % (ref 11.5–14.5)
WBC: 7.2 10*3/uL (ref 3.8–10.6)

## 2017-07-06 LAB — COMPREHENSIVE METABOLIC PANEL
ALK PHOS: 49 U/L (ref 38–126)
ALT: 45 U/L (ref 17–63)
ANION GAP: 13 (ref 5–15)
AST: 26 U/L (ref 15–41)
Albumin: 4.4 g/dL (ref 3.5–5.0)
BILIRUBIN TOTAL: 0.8 mg/dL (ref 0.3–1.2)
BUN: 16 mg/dL (ref 6–20)
CALCIUM: 9.5 mg/dL (ref 8.9–10.3)
CO2: 22 mmol/L (ref 22–32)
CREATININE: 1.1 mg/dL (ref 0.61–1.24)
Chloride: 102 mmol/L (ref 101–111)
GFR calc Af Amer: >60 mL/min (ref >60)
GFR calc non Af Amer: >60 mL/min (ref >60)
GLUCOSE: 100 mg/dL — AB (ref 65–99)
Potassium: 4.1 mmol/L (ref 3.5–5.1)
Sodium: 137 mmol/L (ref 135–145)
TOTAL PROTEIN: 7.3 g/dL (ref 6.5–8.1)

## 2017-07-06 LAB — LACTATE DEHYDROGENASE: LDH: 118 U/L (ref 98–192)

## 2017-07-06 NOTE — Assessment & Plan Note (Addendum)
#   Splenomegaly-  Unclear etiology.   ULTRASOUND DONE IN April 2017-  STABLE SLIGHTLY DECREASED VOLUME OF THE SPLEEN COMPARED TO  Ultrasound in august 2016.  #However given the onset of symptoms of night sweats/fatigue/early satiety-diarrhea recommend repeat evaluation with CT scan of the abdomen pelvis.  Also order labs-immunoglobulins/M protein  #  CT scan asap; labs today; call- [phone]/ will call with results/plan of care; Follow up TBD.   Addendum: Discussed with the patient the results of the CT scan-shows no evidence of splenomegaly or lymphadenopathy suggestive of lymphoma-in view of his symptoms-night sweats/fatigue etc.  Reviewed the incidental findings of fatty liver/lung nodules-likely benign; kidney cysts-likely benign.  He is recommended to follow-up with PCP for further workup/evaluation of his symptoms.  Follow-up with me only as needed patient agrees with the plan.  Labs-including CBC CMP myeloma panel LDH normal  Cc: Dr.Lada.

## 2017-07-06 NOTE — Progress Notes (Signed)
[facility] CONSULT NOTE  Patient Care Team: Lada, Satira Anis, MD as PCP - General (Family Medicine) Roselee Nova, MD (Family Medicine) Cammie Sickle, MD as Consulting Physician (Internal Medicine)  CHIEF COMPLAINTS/PURPOSE OF CONSULTATION: Splenomegaly.  # Aug 2016- MILD to MOD SPLENOMEGALY; Oct CT- no LAD;  April 2017- Stable/Improved. March 2019- CT scan- NO SPLENOMEGALY.   HISTORY OF PRESENTING ILLNESS:  Kenneth Moreno 47 y.o. male noted to have a splenomegaly on ultrasound in August 2016;  Patient is here for follow-up.  Patient complains of drenching night sweats almost every day for the last 3 months.  Admits to subjective fevers; however no temperatures recorded.  Early satiety/poor satiety.  Also complains of 2-3 loose stools a day for the last 3 months also. Denies any weight loss.    ROS: A complete 10 point review of system is done which is negative except mentioned above in history of present illness  Social history:  No smoking or alcohol. Patient  Lives with his wife.  He works  In Field se[employer]ologist.  MEDICAL HISTORY:  Past Medical History:  Diagnosis Date  . Airway hyperreactivity   . Anxiety   . Asthma   . Depression   . Hematuria   . HLD (hyperlipidemia)   . Hypertriglyceridemia   . Kidney stone on right side   . Microscopic hematuria   . Obesity   . Post-nasal drainage   . Renal disorder   . Restless leg   . Right ureteral stone   . RUQ pain   . Sleep apnea   . Splenomegaly   . Vertigo, benign positional     SURGICAL HISTORY: Past Surgical History:  Procedure Laterality Date  . ganglion cyst removal  2006  . LITHOTRIPSY    . SINUS SURGERY WITH [place]  . TONSILLECTOMY  1990    SOCIAL HISTORY: Social History   Socioeconomic History  . Marital status: Married    Spouse name: Not on file  . Number of children: Not on file  . Years of education: Not on file  . Highest education level: Not on file   Social Needs  . Financial resource strain: Not on file  . Food insecurity - worry: Not on file  . Food insecurity - inability: Not on file  . Transportation needs - medical: Not on file  . Transportation needs - non-medical: Not on file  Occupational History  . Not on file  Tobacco Use  . Smoking status: Never Smoker  . Smokeless tobacco: Never Used  Substance and Sexual Activity  . Alcohol use: No  . Drug use: No  . Sexual activity: Yes  Other Topics Concern  . Not on file  Social History Narrative  . Not on file    FAMILY HISTORY: Family History  Problem Relation Age of Onset  . Colon cancer Brother   . Colon cancer Maternal Uncle   . Diabetes Mellitus II Father   . Hyperlipidemia Father   . COPD Father   . Hypertension Daughter   . Prostate cancer Paternal Uncle   . Gallbladder disease Mother   . Hearing loss Mother        one of her ears. pt not sure which one  . Panic disorder Sister   . Hyperlipidemia Maternal Grandmother   . Gallbladder disease Maternal Grandmother   . Hyperlipidemia Maternal Grandfather   . Congestive Heart Failure Paternal Grandfather   . Hyperlipidemia Paternal Grandfather   .  Diabetes Paternal Grandfather   . Kidney disease Neg Hx     ALLERGIES:  is allergic to penicillins.  MEDICATIONS:  Current Outpatient Medications  Medication Sig Dispense Refill  . ALPRAZolam (XANAX) 0.5 MG tablet Take 1 tablet (0.5 mg total) by mouth 2 (two) times daily as needed for anxiety. 60 tablet 2  . omega-3 acid ethyl esters (LOVAZA) 1 g capsule Take 2 capsules (2 g total) by mouth 2 (two) times daily. 120 capsule 5  . PARoxetine (PAXIL) 20 MG tablet Take 1 tablet (20 mg total) by mouth 2 (two) times daily. 180 tablet 1  . Vitamin D, Ergocalciferol, (DRISDOL) 50000 units CAPS capsule Take 1 capsule (50,000 Units total) by mouth every 7 (seven) days. For 12 weeks. 12 capsule 0   No current facility-administered medications for this visit.        Marland Kitchen  PHYSICAL EXAMINATION: ECOG PERFORMANCE STATUS: 1 - Symptomatic but completely ambulatory  Vitals:   07/06/17 1438  BP: (!) 142/98  Pulse: 71  Resp: 18  Temp: 98.3 F (36.8 C)  SpO2: 97%   Filed Weights   07/06/17 1438  Weight: 269 lb (122 kg)    GENERAL: Well-nourished well-developed; Alert, no distress and comfortable. He is alone.  EYES: no pallor or icterus OROPHARYNX: no thrush or ulceration; good dentition  NECK: supple, no masses felt LYMPH: no palpable lymphadenopathy in the cervical, axillary or inguinal regions LUNGS: clear to auscultation and No wheeze or crackles HEART/CVS: regular rate & rhythm and no murmurs; No lower extremity edema ABDOMEN: abdomen soft, non-tender and normal bowel sounds; 4 fingers breath below the left costal margin. Musculoskeletal:no cyanosis of digits and no clubbing  PSYCH: alert & oriented x 3 with fluent speech NEURO: no focal motor/sensory deficits SKIN: no rashes or significant lesions  LABORATORY DATA:  I have reviewed the data as listed Lab Results  Component Value Date   WBC 7.2 07/06/2017   HGB 15.9 07/06/2017   HCT 45.5 07/06/2017   MCV 82.3 07/06/2017   PLT 241 07/06/2017   Recent Labs    05/08/17 0949 07/06/17 1525  NA 138 137  K 4.6 4.1  CL 103 102  CO2 28 22  GLUCOSE 110* 100*  BUN 15 16  CREATININE 1.02 1.10  CALCIUM 9.5 9.5  GFRNONAA 87 >60  GFRAA 100 >60  PROT 6.8 7.3  ALBUMIN  --  4.4  AST 16 26  ALT 33 45  ALKPHOS  --  49  BILITOT 0.5 0.8    RADIOGRAPHIC STUDIES: I have personally reviewed the radiological images as listed and agreed with the findings in the report. Ct Abdomen Pelvis W Contrast  Result Date: 07/11/2017 CLINICAL DATA:  Follow-up splenomegaly, diarrhea, night sweats EXAM: CT ABDOMEN AND PELVIS WITH CONTRAST TECHNIQUE: Multidetector CT imaging of the abdomen and pelvis was performed using the standard protocol following bolus administration of intravenous contrast.  CONTRAST:  11mL ISOVUE-300 IOPAMIDOL (ISOVUE-300) INJECTION 61% COMPARISON:  Limited abdominal ultrasound dated 08/12/2015. CT abdomen/pelvis dated 02/05/2015. FINDINGS: Lower chest: 5 mm triangular subpleural nodule in the lateral right middle lobe (series 4/image 1). Additional 3 mm nodule in the right lower lobe (series 4/image 7). These are unchanged from 2016, benign. Hepatobiliary: Hepatic steatosis with focal fatty sparing along the gallbladder fossa. Gallbladder is unremarkable. No intrahepatic or extrahepatic ductal dilatation. Pancreas: Within normal limits. Spleen: Spleen is normal in size, measuring 13.2 cm in maximal craniocaudal dimension. Adrenals/Urinary Tract: Adrenal glands are within normal limits. Bilateral renal cysts,  measuring 1.5 cm in the right upper pole and 1.9 cm in the left upper pole. No hydronephrosis. Bladder is within normal limits. Stomach/Bowel: Stomach is within normal limits. No evidence of bowel obstruction. Normal appendix (series 2/image 62). No colonic wall thickening or inflammatory changes. Vascular/Lymphatic: No evidence of abdominal aortic aneurysm. No suspicious abdominopelvic lymphadenopathy. Reproductive: Prostate is unremarkable. Other: No abdominopelvic ascites. Musculoskeletal: Mild degenerative changes at L5-S1. IMPRESSION: Spleen is normal in size. No colonic wall thickening or inflammatory changes in this patient with history of diarrhea. Mild hepatic steatosis. Additional stable ancillary findings as above. Electronically Signed   By: Julian Hy M.D.   On: 07/11/2017 13:26    ASSESSMENT & PLAN:   # Splenomegaly-  Unclear etiology.   ULTRASOUND DONE IN April 2017-  STABLE SLIGHTLY DECREASED VOLUME OF THE SPLEEN COMPARED TO  Ultrasound in august 2016.  #  As the patient is asymptomatic i would not recommend any further workup at this time.  Again discussed the potential symptoms that might go with lymphoma/ splenomegaly-  Weight loss profuse night  sweats and fevers .  Patient will call if he has any new symptoms.  #  The patient will follow-up with me in one year with labs/  Limited ultrasound of his spleen.     Cammie Sickle, MD 07/17/2017 8:41 AM

## 2017-07-10 LAB — MULTIPLE MYELOMA PANEL, SERUM
ALPHA 1: 0.2 g/dL (ref 0.0–0.4)
ALPHA2 GLOB SERPL ELPH-MCNC: 0.6 g/dL (ref 0.4–1.0)
Albumin SerPl Elph-Mcnc: 3.9 g/dL (ref 2.9–4.4)
Albumin/Glob SerPl: 1.4 (ref 0.7–1.7)
B-GLOBULIN SERPL ELPH-MCNC: 1 g/dL (ref 0.7–1.3)
Gamma Glob SerPl Elph-Mcnc: 0.9 g/dL (ref 0.4–1.8)
Globulin, Total: 2.8 g/dL (ref 2.2–3.9)
IGG (IMMUNOGLOBIN G), SERUM: 989 mg/dL (ref 700–1600)
IgA: 118 mg/dL (ref 90–386)
IgM (Immunoglobulin M), Srm: 21 mg/dL (ref 20–172)
TOTAL PROTEIN ELP: 6.7 g/dL (ref 6.0–8.5)

## 2017-07-11 ENCOUNTER — Ambulatory Visit
Admission: RE | Admit: 2017-07-11 | Discharge: 2017-07-11 | Disposition: A | Discharge status: Home / Self Care | Payer: 59 | Source: Ambulatory Visit | Attending: Internal Medicine | Admitting: Internal Medicine

## 2017-07-11 ENCOUNTER — Encounter: Payer: Self-pay | Admitting: Family Medicine

## 2017-07-11 ENCOUNTER — Telehealth: Payer: Self-pay | Admitting: Family Medicine

## 2017-07-11 DIAGNOSIS — R197 Diarrhea, unspecified: Secondary | ICD-10-CM | POA: Insufficient documentation

## 2017-07-11 DIAGNOSIS — K760 Fatty (change of) liver, not elsewhere classified: Secondary | ICD-10-CM | POA: Insufficient documentation

## 2017-07-11 DIAGNOSIS — R161 Splenomegaly, not elsewhere classified: Secondary | ICD-10-CM | POA: Diagnosis present

## 2017-07-11 MED ORDER — IOPAMIDOL (ISOVUE-300) INJECTION 61%
100.0000 mL | Freq: Once | INTRAVENOUS | Status: AC | PRN
  Administered 2017-07-11: 100 mL via INTRAVENOUS

## 2017-07-11 NOTE — Telephone Encounter (Signed)
Patient is needing a letter stating that he is not being prescribed alprazolam from our office.  Please contact patient when he can come pick this up.  His call back # is [phone]

## 2017-07-11 NOTE — Telephone Encounter (Signed)
Letter ready; please review and have patient review prior to letter leaving office in case it needs clarification.

## 2017-07-12 NOTE — Telephone Encounter (Signed)
Spoke with patient's wife on this morning informing her that [facility] can come pick up his requested letter. Sonia Baller, his wife, stated that she would give him the message.

## 2017-07-17 ENCOUNTER — Ambulatory Visit: Payer: 59

## 2017-07-30 ENCOUNTER — Ambulatory Visit: Payer: 59 | Admitting: Psychiatry

## 2017-08-07 ENCOUNTER — Ambulatory Visit: Payer: 59 | Admitting: Family Medicine

## 2017-08-09 ENCOUNTER — Other Ambulatory Visit: Payer: Self-pay

## 2017-08-09 DIAGNOSIS — F411 Generalized anxiety disorder: Secondary | ICD-10-CM

## 2017-08-09 MED ORDER — PAROXETINE HCL 40 MG PO TABS: 40.0000 mg | ORAL_TABLET | ORAL | 2 refills | Status: DC

## 2017-08-09 NOTE — Telephone Encounter (Signed)
Switch from two 20 mg pills to a single 40 mg pill

## 2017-08-09 NOTE — Telephone Encounter (Signed)
Pharmacy states that of pt is to take Paroxetine twice a day, then his insurance will require a Prior Authorization. Pharmacy would like to know if you would like to to the PA? If so the contact number given in [phone].  If not the pharmacy wants to know if you want to prescribe an alternative?  Paper on your desk.

## 2017-08-10 ENCOUNTER — Telehealth: Payer: Self-pay

## 2017-08-10 NOTE — Telephone Encounter (Signed)
Is this letter something that you can provide? Please see office notes from March. Thanks

## 2017-08-10 NOTE — Telephone Encounter (Signed)
What is he needing? I've already written a letter

## 2017-08-14 NOTE — Telephone Encounter (Signed)
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 One he has for the xanax but he needs one from Dr. Sanda Klein stating she will be the only one prescribing the PARoxetine (PAXIL) 40 MG tablet

## 2017-08-14 NOTE — Telephone Encounter (Signed)
We referred him to psychiatrist I thought

## 2017-08-14 NOTE — Telephone Encounter (Signed)
Called pt no answer. LM for pt to call back and give details on if he is needing a new letter or just another copy of the letter that was previously written. CRM created.

## 2017-08-15 ENCOUNTER — Telehealth: Payer: Self-pay

## 2017-08-15 ENCOUNTER — Encounter: Payer: Self-pay | Admitting: Family Medicine

## 2017-08-15 ENCOUNTER — Ambulatory Visit (INDEPENDENT_AMBULATORY_CARE_PROVIDER_SITE_OTHER): Payer: 59 | Admitting: Family Medicine

## 2017-08-15 DIAGNOSIS — F411 Generalized anxiety disorder: Secondary | ICD-10-CM | POA: Diagnosis not present

## 2017-08-15 DIAGNOSIS — E781 Pure hyperglyceridemia: Secondary | ICD-10-CM | POA: Diagnosis not present

## 2017-08-15 DIAGNOSIS — E785 Hyperlipidemia, unspecified: Secondary | ICD-10-CM

## 2017-08-15 DIAGNOSIS — E669 Obesity, unspecified: Secondary | ICD-10-CM | POA: Diagnosis not present

## 2017-08-15 DIAGNOSIS — C8207 Follicular lymphoma grade I, spleen: Secondary | ICD-10-CM

## 2017-08-15 DIAGNOSIS — K760 Fatty (change of) liver, not elsewhere classified: Secondary | ICD-10-CM | POA: Diagnosis not present

## 2017-08-15 MED ORDER — HYDROXYZINE PAMOATE 25 MG PO CAPS: 25.0000 mg | ORAL_CAPSULE | Freq: Four times a day (QID) | ORAL | 1 refills | Status: DC | PRN

## 2017-08-15 NOTE — Assessment & Plan Note (Signed)
Encouraged weight loss, diet low in saturated fats 

## 2017-08-15 NOTE — Progress Notes (Signed)
BP 118/82   Pulse 86   Temp 98.1 F (36.7 C) (Oral)   Resp 14   Ht 6' (1.829 m)   Wt 269 lb 14.4 oz (122.4 kg)   SpO2 95%   BMI 36.61 kg/m    Subjective:    Patient ID: Kenneth Moreno, male    DOB: 08/09/1970, 47 y.o.   MRN: [id number]  HPI: Kenneth Moreno is a 48 y.o. male  Chief Complaint  Patient presents with  . Letter for School/Work    for DT on Paxil    HPI Patient is here for anxiety He was referred to psychiatrist but did not go because that he understood that they would not prescribe benzo He has been on the paxil for years and years; since he was about 47 years old The [employer] has transferred his records for another appointment at another office after he got a 4 week extension Dr. Manuella Ghazi had written a letter for him that stated that he had been on paxil and had been stable for 36 months (see July 22, 2015) He just takes the paxil for anxiety, NOT depression He doesn't have any tickets, any wrecks, nothing against him; the medicine is just on his list (the paxil); he understands the benzo being on their list and not being able to drive; he only used Xanax PRN; he refilled less and only used them PRN; 30 pills a year he estimates Anxiety runs in the family No HI/SI He recalls early anxiety attack, going to the hospital; tried a few things to start with, figured out which one worked best; while they were trying to figure out which one worked best and might take 6 weeks to kick in, so they put him on xanax "which helped tremendously"; he ccould tell he was taking something, "felt like a zombie" but couldn't keep having those spells of anxiety; took it daily for a while, and he worked with his employer, 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; changed med and the paxil did a whole lot better; started backing the xanax down and finally just as needed His grandmother had anxiety He denies any depression  He had a CT scan recently for his heme-onc doctor, fatty liver  discovered  Obesity; he asked about keto diet; I discouraged him from that; cousins have been on every diet known to earth and joined the gym, they do good for 6 to 8 weeks, I asked about walking or other exercise; 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 Airway hyperreactivity   . Anxiety   . Asthma   . Depression   . Hematuria   . HLD (hyperlipidemia)   . Hypertriglyceridemia   . Kidney stone on right side   . Microscopic hematuria   . Obesity   . Post-nasal drainage   . Renal disorder   . Restless leg   . Right ureteral stone   . RUQ pain   . Sleep apnea   . Splenomegaly   . Vertigo, benign positional    Past Surgical History:  Procedure Laterality Date  . ganglion cyst removal  2006  . LITHOTRIPSY    . SINUS SURGERY WITH [place]  . TONSILLECTOMY  1990   Family History  Problem Relation Age of Onset  . Colon cancer Brother   . Colon cancer Maternal Uncle   . Diabetes Mellitus II Father   . Hyperlipidemia Father   . COPD Father   . Hypertension Daughter   . Prostate cancer Paternal Uncle   . Gallbladder disease Mother   . Hearing loss Mother        one of her ears. pt not sure which one  . Panic disorder Sister   . Hyperlipidemia Maternal Grandmother   . Gallbladder disease Maternal Grandmother   . Hyperlipidemia Maternal Grandfather   . Congestive Heart Failure Paternal Grandfather   . Hyperlipidemia Paternal Grandfather   . Diabetes Paternal Grandfather   . Kidney disease Neg Hx    Social History   Tobacco Use  . Smoking status: Never Smoker  .  Smokeless tobacco: Never Used  Substance Use Topics  . Alcohol use: No  . Drug use: No    Interim medical history since last visit reviewed. Allergies and medications reviewed  Review of Systems Per HPI unless specifically indicated above     Objective:    BP 118/82   Pulse 86   Temp 98.1 F (36.7 C) (Oral)   Resp 14   Ht 6' (1.829 m)   Wt 269 lb 14.4 oz (122.4 kg)   SpO2 95%   BMI 36.61 kg/m   Wt Readings from Last 3 Encounters:  08/15/17 269 lb 14.4 oz (122.4 kg)  07/06/17 269 lb (122 kg)  07/02/17 272 lb 6.4 oz (123.6 kg)    Physical Exam  Constitutional: He appears well-developed and well-nourished. No distress.  obese  HENT:  Head: Normocephalic and atraumatic.  Eyes: EOM are normal. No scleral icterus.  Neck: No thyromegaly present.  Cardiovascular: Normal rate and regular rhythm.  Pulmonary/Chest: Effort normal and breath sounds normal.  Abdominal: Soft. Bowel sounds are normal. He exhibits no distension.  Musculoskeletal: He exhibits no edema.  Neurological: Coordination normal.  Skin: Skin is warm and dry. No pallor.  Psychiatric: He has a normal mood and affect. His behavior is normal. Judgment and thought content normal.      Assessment & Plan:   Problem List Items Addressed This Visit      Digestive   Fatty liver (Chronic)    Encouraged weight loss, diet low in saturated fats        Other   Obesity (BMI 35.0-39.9 without comorbidity)    Encouraged weight loss      Anxiety, generalized    Continue the paxil; will add hydroxyzine for PRN use, but do NOT drive for 6 hours after taking; coping strategies discussed      Relevant Medications   hydrOXYzine (VISTARIL) 25 MG capsule   Lymphoma ([place])    Followed by cancer doctor      Hypertriglyceridemia (Chronic)    Diet and weight loss      Dyslipidemia (Chronic)    Diet lower in saturated fats and weight loss would help lipid panel and likely fatty liver as well          Follow up  plan: Return in about 6 months (around 02/14/2018) for follow-up visit with Dr. Sanda Klein.  An after-visit summary was printed and given to the patient at [place].  Please see the patient instructions which  may contain other information and recommendations beyond what is mentioned above in the assessment and plan.  Meds ordered this encounter  Medications  . hydrOXYzine (VISTARIL) 25 MG capsule    Sig: Take 1 capsule (25 mg total) by mouth every 6 (six) hours as needed. Do not drive for 6 hours after taking this medicine    Dispense:  30 capsule    Refill:  1    No orders of the defined types were placed in this encounter.

## 2017-08-15 NOTE — Assessment & Plan Note (Signed)
Continue the paxil; will add hydroxyzine for PRN use, but do NOT drive for 6 hours after taking; coping strategies discussed

## 2017-08-15 NOTE — Patient Instructions (Addendum)
Check out the information at familydoctor.org entitled "Nutrition for Weight Loss: What You Need to Know about Fad Diets" Try to lose between 1-2 pounds per week by taking in fewer calories and burning off more calories You can succeed by limiting portions, limiting foods dense in calories and fat, becoming more active, and drinking 8 glasses of water a day (64 ounces) Don't skip meals, especially breakfast, as skipping meals may alter your metabolism Do not use over-the-counter weight loss pills or gimmicks that claim rapid weight loss A healthy BMI (or body mass index) is between 18.5 and 24.9 You can calculate your ideal BMI at the NIH website http://www.nhlbi.nih.gov/health/educational/lose_wt/BMI/bmicalc.htm   Preventing Unhealthy Weight Gain, Adult Staying at a healthy weight is important. When fat builds up in your body, you may become overweight or obese. These conditions put you at greater risk for developing certain health problems, such as heart disease, diabetes, sleeping problems, joint problems, and some cancers. Unhealthy weight gain is often the result of making unhealthy choices in what you eat. It is also a result of not getting enough exercise. You can make changes to your lifestyle to prevent obesity and stay as healthy as possible. What nutrition changes can be made? To maintain a healthy weight and prevent obesity:  Eat only as much as your body needs. To do this: ? Pay attention to signs that you are hungry or full. Stop eating as soon as you feel full. ? If you feel hungry, try drinking water first. Drink enough water so your urine is clear or pale yellow. ? Eat smaller portions. ? Look at serving sizes on food labels. Most foods contain more than one serving per container. ? Eat the recommended amount of calories for your gender and activity level. While most active people should eat around 2,000 calories per day, if you are trying to lose weight or are not very active,  you main need to eat less calories. Talk to your health care provider or dietitian about how many calories you should eat each day.  Choose healthy foods, such as: ? Fruits and vegetables. Try to fill at least half of your plate at each meal with fruits and vegetables. ? Whole grains, such as whole wheat bread, brown rice, and quinoa. ? Lean meats, such as chicken or fish. ? Other healthy proteins, such as beans, eggs, or tofu. ? Healthy fats, such as nuts, seeds, fatty fish, and olive oil. ? Low-fat or fat-free dairy.  Check food labels and avoid food and drinks that: ? Are high in calories. ? Have added sugar. ? Are high in sodium. ? Have saturated fats or trans fats.  Limit how much you eat of the following foods: ? Prepackaged meals. ? Fast food. ? Fried foods. ? Processed meat, such as bacon, sausage, and deli meats. ? Fatty cuts of red meat and poultry with skin.  Cook foods in healthier ways, such as by baking, broiling, or grilling.  When grocery shopping, try to shop around the outside of the store. This helps you buy mostly fresh foods and avoid canned and prepackaged foods.  What lifestyle changes can be made?  Exercise at least 30 minutes 5 or more days each week. Exercising includes brisk walking, yard work, biking, running, swimming, and team sports like basketball and soccer. Ask your health care provider which exercises are safe for you.  Do not use any products that contain nicotine or tobacco, such as cigarettes and e-cigarettes. If you need help quitting,   your health care provider.  Limit alcohol intake to no more than 1 drink a day for nonpregnant women and 2 drinks a day for men. One drink equals 12 oz of beer, 5 oz of wine, or 1 oz of hard liquor.  Try to get 7-9 hours of sleep each night. What other changes can be made?  Keep a food and activity journal to keep track of: ? What you ate and how many calories you had. Remember to count sauces, dressings,  and side dishes. ? Whether you were active, and what exercises you did. ? Your calorie, weight, and activity goals.  Check your weight regularly. Track any changes. If you notice you have gained weight, make changes to your diet or activity routine.  Avoid taking weight-loss medicines or supplements. Talk to your health care provider before starting any new medicine or supplement.  Talk to your health care provider before trying any new diet or exercise plan. Why are these changes important? Eating healthy, staying active, and having healthy habits not only help prevent obesity, they also:  Help you to manage stress and emotions.  Help you to connect with friends and family.  Improve your self-esteem.  Improve your sleep.  Prevent long-term health problems.  What can happen if changes are not made? Being obese or overweight can cause you to develop joint or bone problems, which can make it hard for you to stay active or do activities you enjoy. Being obese or overweight also puts stress on your heart and lungs and can lead to health problems like diabetes, heart disease, and some cancers. Where to find more information: Talk with your health care provider or a dietitian about healthy eating and healthy lifestyle choices. You may also find other information through these resources:  [place] Department of Agriculture MyPlate: FormerBoss.no  American Heart Association: www.heart.org  Centers for Disease Control and Prevention: http://www.wolf.info/  Summary  Staying at a healthy weight is important. It helps prevent certain diseases and health problems, such as heart disease, diabetes, joint problems, sleep disorders, and some cancers.  Being obese or overweight can cause you to develop joint or bone problems, which can make it hard for you to stay active or do activities you enjoy.  You can prevent unhealthy weight gain by eating a healthy diet, exercising regularly, not smoking,  limiting alcohol, and getting enough sleep.  Talk with your health care provider or a dietitian for guidance about healthy eating and healthy lifestyle choices. This information is not intended to replace advice given to you by your health care provider. Make sure you discuss any questions you have with your health care provider. Document Released: 04/18/2016 Document Revised: 05/24/2016 Document Reviewed: 05/24/2016 Elsevier Interactive Patient Education  2018 [employer].  Obesity, Adult Obesity is the condition of having too much total body fat. Being overweight or obese means that your weight is greater than what is considered healthy for your body size. Obesity is determined by a measurement called BMI. BMI is an estimate of body fat and is calculated from height and weight. For adults, a BMI of 30 or higher is considered obese. Obesity can eventually lead to other health concerns and major illnesses, including:  Stroke.  Coronary artery disease (CAD).  Type 2 diabetes.  Some types of cancer, including cancers of the colon, breast, uterus, and gallbladder.  Osteoarthritis.  High blood pressure (hypertension).  High cholesterol.  Sleep apnea.  Gallbladder stones.  Infertility problems.  What are the causes?  The main cause of obesity is taking in (consuming) more calories than your body uses for energy. Other factors that contribute to this condition may include:  Being born with genes that make you more likely to become obese.  Having a medical condition that causes obesity. These conditions include: ? Hypothyroidism. ? Polycystic ovarian syndrome (PCOS). ? Binge-eating disorder. ? Cushing syndrome.  Taking certain medicines, such as steroids, antidepressants, and seizure medicines.  Not being physically active (sedentary lifestyle).  Living where there are limited places to exercise safely or buy healthy foods.  Not getting enough sleep.  What increases the  risk? The following factors may increase your risk of this condition:  Having a family history of obesity.  Being a woman of African-American descent.  Being a man of Hispanic descent.  What are the signs or symptoms? Having excessive body fat is the main symptom of this condition. How is this diagnosed? This condition may be diagnosed based on:  Your symptoms.  Your medical history.  A physical exam. Your health care provider may measure: ? Your BMI. If you are an adult with a BMI between 25 and less than 30, you are considered overweight. If you are an adult with a BMI of 30 or higher, you are considered obese. ? The distances around your hips and your waist (circumferences). These may be compared to each other to help diagnose your condition. ? Your skinfold thickness. Your health care provider may gently pinch a fold of your skin and measure it.  How is this treated? Treatment for this condition often includes changing your lifestyle. Treatment may include some or all of the following:  Dietary changes. Work with your health care provider and a dietitian to set a weight-loss goal that is healthy and reasonable for you. Dietary changes may include eating: ? Smaller portions. A portion size is the amount of a particular food that is healthy for you to eat at one time. This varies from person to person. ? Low-calorie or low-fat options. ? More whole grains, fruits, and vegetables.  Regular physical activity. This may include aerobic activity (cardio) and strength training.  Medicine to help you lose weight. Your health care provider may prescribe medicine if you are unable to lose 1 pound a week after 6 weeks of eating more healthily and doing more physical activity.  Surgery. Surgical options may include gastric banding and gastric bypass. Surgery may be done if: ? Other treatments have not helped to improve your condition. ? You have a BMI of 40 or higher. ? You have  life-threatening health problems related to obesity.  Follow these instructions at home:  Eating and drinking   Follow recommendations from your health care provider about what you eat and drink. Your health care provider may advise you to: ? Limit fast foods, sweets, and processed snack foods. ? Choose low-fat options, such as low-fat milk instead of whole milk. ? Eat 5 or more servings of fruits or vegetables every day. ? Eat at home more often. This gives you more control over what you eat. ? Choose healthy foods when you eat out. ? Learn what a healthy portion size is. ? Keep low-fat snacks on hand. ? Avoid sugary drinks, such as soda, fruit juice, iced tea sweetened with sugar, and flavored milk. ? Eat a healthy breakfast.  Drink enough water to keep your urine clear or pale yellow.  Do not go without eating for long periods of time (do not fast) or  follow a fad diet. Fasting and fad diets can be unhealthy and even dangerous. Physical Activity  Exercise regularly, as told by your health care provider. Ask your health care provider what types of exercise are safe for you and how often you should exercise.  Warm up and stretch before being active.  Cool down and stretch after being active.  Rest between periods of activity. Lifestyle  Limit the time that you spend in front of your TV, computer, or video game system.  Find ways to reward yourself that do not involve food.  Limit alcohol intake to no more than 1 drink a day for nonpregnant women and 2 drinks a day for men. One drink equals 12 oz of beer, 5 oz of wine, or 1 oz of hard liquor. General instructions  Keep a weight loss journal to keep track of the food you eat and how much you exercise you get.  Take over-the-counter and prescription medicines only as told by your health care provider.  Take vitamins and supplements only as told by your health care provider.  Consider joining a support group. Your health  care provider may be able to recommend a support group.  Keep all follow-up visits as told by your health care provider. This is important. Contact a health care provider if:  You are unable to meet your weight loss goal after 6 weeks of dietary and lifestyle changes. This information is not intended to replace advice given to you by your health care provider. Make sure you discuss any questions you have with your health care provider. Document Released: 05/25/2004 Document Revised: 09/20/2015 Document Reviewed: 02/03/2015 Elsevier Interactive Patient Education  2018 [employer].

## 2017-08-15 NOTE — Telephone Encounter (Signed)
Patient came in after speaking with wife he needs a  Letter stating he has been on paxil and has been on the 40mg  with no change and doing well.  He states That the psych will not be giving him this med, also they psych he saw also does not prescribe xanax so he has stopped and will not be returning.  He states there is no point seeing 2 different doctors if neither will prescribe xanax.  So he will stay on paxil and continue coming here.  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

## 2017-08-15 NOTE — Telephone Encounter (Signed)
Done, patient seen today

## 2017-08-15 NOTE — Telephone Encounter (Signed)
Notified, they will contact psych

## 2017-08-15 NOTE — Assessment & Plan Note (Signed)
Encouraged weight loss 

## 2017-08-20 ENCOUNTER — Ambulatory Visit: Payer: Self-pay | Admitting: Family Medicine

## 2017-09-02 NOTE — Assessment & Plan Note (Signed)
Diet and weight loss.  

## 2017-09-02 NOTE — Assessment & Plan Note (Signed)
Followed by cancer doctor

## 2017-09-02 NOTE — Assessment & Plan Note (Signed)
Diet lower in saturated fats and weight loss would help lipid panel and likely fatty liver as well

## 2017-09-26 ENCOUNTER — Ambulatory Visit: Payer: 59 | Admitting: Family Medicine

## 2017-11-05 ENCOUNTER — Other Ambulatory Visit: Payer: Self-pay | Admitting: Family Medicine

## 2018-01-01 ENCOUNTER — Other Ambulatory Visit: Payer: Self-pay | Admitting: Family Medicine

## 2018-01-01 NOTE — Telephone Encounter (Signed)
Last 4/17 Next 10/18

## 2018-02-15 ENCOUNTER — Ambulatory Visit: Payer: Self-pay | Admitting: Family Medicine

## 2018-03-25 ENCOUNTER — Telehealth: Payer: Self-pay | Admitting: Family Medicine

## 2018-03-25 NOTE — Telephone Encounter (Signed)
Spoke with wife Patrici Ranks and she will inform him to return call

## 2018-03-25 NOTE — Telephone Encounter (Signed)
Patient canceled his last appt Please ask him to reschedule I'll refill this time and we look forward to seeing him soon

## 2018-04-15 ENCOUNTER — Telehealth: Payer: Self-pay | Admitting: Family Medicine

## 2018-04-15 NOTE — Telephone Encounter (Signed)
Patient was due to be seen in October There is a note in November asking him to schedule an appointment No appointment scheduled yet I'll send in one week Please ask him to schedule and keep an appointment soon Thank you

## 2018-04-16 MED ORDER — PAROXETINE HCL 40 MG PO TABS: 40.0000 mg | ORAL_TABLET | Freq: Every day | ORAL | 0 refills | Status: DC

## 2018-04-16 NOTE — Telephone Encounter (Signed)
Spoke with wife and she will have pt to return call

## 2018-04-16 NOTE — Telephone Encounter (Signed)
I just sent another 30 pills He'll need an appt to get further refills

## 2018-04-16 NOTE — Addendum Note (Signed)
Addended by: Tanielle Emigh, Satira Anis on: 04/16/2018 12:23 PM   Modules accepted: Orders

## 2018-04-16 NOTE — Telephone Encounter (Addendum)
Patient calling and states that he does not have insurance until January. Would like to know if Dr Sanda Klein could work with him on the medication until then? Please advise. Patient did not want to schedule until getting an answer from Dr Sanda Klein.  CB#: [phone]

## 2018-04-16 NOTE — Telephone Encounter (Signed)
Spoke with pt and he scheduled an appt for 1.16.19

## 2018-05-10 ENCOUNTER — Other Ambulatory Visit: Payer: Self-pay | Admitting: Family Medicine

## 2018-05-11 ENCOUNTER — Other Ambulatory Visit: Payer: Self-pay | Admitting: Family Medicine

## 2018-05-12 NOTE — Telephone Encounter (Signed)
He has an appt 05/16/18

## 2018-05-15 ENCOUNTER — Other Ambulatory Visit: Payer: Self-pay | Admitting: Family Medicine

## 2018-05-16 ENCOUNTER — Ambulatory Visit: Payer: Self-pay | Admitting: Family Medicine

## 2018-05-23 ENCOUNTER — Encounter: Payer: Self-pay | Admitting: Family Medicine

## 2018-05-23 ENCOUNTER — Ambulatory Visit (INDEPENDENT_AMBULATORY_CARE_PROVIDER_SITE_OTHER): Payer: BLUE CROSS/BLUE SHIELD | Admitting: Family Medicine

## 2018-05-23 VITALS — BP 124/78 | HR 73 | Temp 98.0°F | Ht 72.0 in | Wt 269.0 lb

## 2018-05-23 DIAGNOSIS — E669 Obesity, unspecified: Secondary | ICD-10-CM

## 2018-05-23 DIAGNOSIS — E781 Pure hyperglyceridemia: Secondary | ICD-10-CM | POA: Diagnosis not present

## 2018-05-23 DIAGNOSIS — C8207 Follicular lymphoma grade I, spleen: Secondary | ICD-10-CM | POA: Diagnosis not present

## 2018-05-23 DIAGNOSIS — R739 Hyperglycemia, unspecified: Secondary | ICD-10-CM

## 2018-05-23 DIAGNOSIS — Z23 Encounter for immunization: Secondary | ICD-10-CM | POA: Diagnosis not present

## 2018-05-23 DIAGNOSIS — K760 Fatty (change of) liver, not elsewhere classified: Secondary | ICD-10-CM | POA: Diagnosis not present

## 2018-05-23 DIAGNOSIS — F411 Generalized anxiety disorder: Secondary | ICD-10-CM | POA: Diagnosis not present

## 2018-05-23 DIAGNOSIS — R161 Splenomegaly, not elsewhere classified: Secondary | ICD-10-CM

## 2018-05-23 DIAGNOSIS — E559 Vitamin D deficiency, unspecified: Secondary | ICD-10-CM

## 2018-05-23 MED ORDER — OMEGA-3-ACID ETHYL ESTERS 1 G PO CAPS: 2.0000 g | ORAL_CAPSULE | Freq: Two times a day (BID) | ORAL | 11 refills | Status: DC

## 2018-05-23 MED ORDER — PAROXETINE HCL 40 MG PO TABS: ORAL_TABLET | ORAL | 3 refills | Status: DC

## 2018-05-23 NOTE — Assessment & Plan Note (Signed)
Check lipids today; fasting since 8 pm last night (truly fasting); limit straches and fried foods

## 2018-05-23 NOTE — Patient Instructions (Addendum)
Try to limit sodium when cooking and eating Check out the information at familydoctor.org entitled "Nutrition for Weight Loss: What You Need to Know about Fad Diets" Try to lose between 1-2 pounds per week by taking in fewer calories and burning off more calories You can succeed by limiting portions, limiting foods dense in calories and fat, becoming more active, and drinking 8 glasses of water a day (64 ounces) Don't skip meals, especially breakfast, as skipping meals may alter your metabolism Do not use over-the-counter weight loss pills or gimmicks that claim rapid weight loss A healthy BMI (or body mass index) is between 18.5 and 24.9 You can calculate your ideal BMI at the [place] website ClubMonetize.fr   Obesity, Adult Obesity is the condition of having too much total body fat. Being overweight or obese means that your weight is greater than what is considered healthy for your body size. Obesity is determined by a measurement called BMI. BMI is an estimate of body fat and is calculated from height and weight. For adults, a BMI of 30 or higher is considered obese. Obesity can eventually lead to other health concerns and major illnesses, including:  Stroke.  Coronary artery disease (CAD).  Type 2 diabetes.  Some types of cancer, including cancers of the colon, breast, uterus, and gallbladder.  Osteoarthritis.  High blood pressure (hypertension).  High cholesterol.  Sleep apnea.  Gallbladder stones.  Infertility problems. What are the causes? The main cause of obesity is taking in (consuming) more calories than your body uses for energy. Other factors that contribute to this condition may include:  Being born with genes that make you more likely to become obese.  Having a medical condition that causes obesity. These conditions include: ? Hypothyroidism. ? Polycystic ovarian syndrome (PCOS). ? Binge-eating  disorder. ? Cushing syndrome.  Taking certain medicines, such as steroids, antidepressants, and seizure medicines.  Not being physically active (sedentary lifestyle).  Living where there are limited places to exercise safely or buy healthy foods.  Not getting enough sleep. What increases the risk? The following factors may increase your risk of this condition:  Having a family history of obesity.  Being a woman of African-American descent.  Being a man of Hispanic descent. What are the signs or symptoms? Having excessive body fat is the main symptom of this condition. How is this diagnosed? This condition may be diagnosed based on:  Your symptoms.  Your medical history.  A physical exam. Your health care provider may measure: ? Your BMI. If you are an adult with a BMI between 25 and less than 30, you are considered overweight. If you are an adult with a BMI of 30 or higher, you are considered obese. ? The distances around your hips and your waist (circumferences). These may be compared to each other to help diagnose your condition. ? Your skinfold thickness. Your health care provider may gently pinch a fold of your skin and measure it. How is this treated? Treatment for this condition often includes changing your lifestyle. Treatment may include some or all of the following:  Dietary changes. Work with your health care provider and a dietitian to set a weight-loss goal that is healthy and reasonable for you. Dietary changes may include eating: ? Smaller portions. A portion size is the amount of a particular food that is healthy for you to eat at one time. This varies from person to person. ? Low-calorie or low-fat options. ? More whole grains, fruits, and vegetables.  Regular  physical activity. This may include aerobic activity (cardio) and strength training.  Medicine to help you lose weight. Your health care provider may prescribe medicine if you are unable to lose 1 pound  a week after 6 weeks of eating more healthily and doing more physical activity.  Surgery. Surgical options may include gastric banding and gastric bypass. Surgery may be done if: ? Other treatments have not helped to improve your condition. ? You have a BMI of 40 or higher. ? You have life-threatening health problems related to obesity. Follow these instructions at home:  Eating and drinking   Follow recommendations from your health care provider about what you eat and drink. Your health care provider may advise you to: ? Limit fast foods, sweets, and processed snack foods. ? Choose low-fat options, such as low-fat milk instead of whole milk. ? Eat 5 or more servings of fruits or vegetables every day. ? Eat at home more often. This gives you more control over what you eat. ? Choose healthy foods when you eat out. ? Learn what a healthy portion size is. ? Keep low-fat snacks on hand. ? Avoid sugary drinks, such as soda, fruit juice, iced tea sweetened with sugar, and flavored milk. ? Eat a healthy breakfast.  Drink enough water to keep your urine clear or pale yellow.  Do not go without eating for long periods of time (do not fast) or follow a fad diet. Fasting and fad diets can be unhealthy and even dangerous. Physical Activity  Exercise regularly, as told by your health care provider. Ask your health care provider what types of exercise are safe for you and how often you should exercise.  Warm up and stretch before being active.  Cool down and stretch after being active.  Rest between periods of activity. Lifestyle  Limit the time that you spend in front of your TV, computer, or video game system.  Find ways to reward yourself that do not involve food.  Limit alcohol intake to no more than 1 drink a day for nonpregnant women and 2 drinks a day for men. One drink equals 12 oz of beer, 5 oz of wine, or 1 oz of hard liquor. General instructions  Keep a weight loss journal to  keep track of the food you eat and how much you exercise you get.  Take over-the-counter and prescription medicines only as told by your health care provider.  Take vitamins and supplements only as told by your health care provider.  Consider joining a support group. Your health care provider may be able to recommend a support group.  Keep all follow-up visits as told by your health care provider. This is important. Contact a health care provider if:  You are unable to meet your weight loss goal after 6 weeks of dietary and lifestyle changes. This information is not intended to replace advice given to you by your health care provider. Make sure you discuss any questions you have with your health care provider. Document Released: 05/25/2004 Document Revised: 09/20/2015 Document Reviewed: 02/03/2015 Elsevier Interactive Patient Education  2019 Elsevier Inc.  Preventing Unhealthy [employer], Adult Staying at a healthy weight is important to your overall health. When fat builds up in your body, you may become overweight or obese. Being overweight or obese increases your risk of developing certain health problems, such as heart disease, diabetes, sleeping problems, joint problems, and some types of cancer. Unhealthy weight gain is often the result of making unhealthy food choices  or not getting enough exercise. You can make changes to your lifestyle to prevent obesity and stay as healthy as possible. What nutrition changes can be made?   Eat only as much as your body needs. To do this: ? Pay attention to signs that you are hungry or full. Stop eating as soon as you feel full. ? If you feel hungry, try drinking water first before eating. Drink enough water so your urine is clear or pale yellow. ? Eat smaller portions. Pay attention to portion sizes when eating out. ? Look at serving sizes on food labels. Most foods contain more than one serving per container. ? Eat the recommended number of  calories for your gender and activity level. For most active people, a daily total of 2,000 calories is appropriate. If you are trying to lose weight or are not very active, you may need to eat fewer calories. Talk with your health care provider or a diet and nutrition specialist (dietitian) about how many calories you need each day.  Choose healthy foods, such as: ? Fruits and vegetables. At each meal, try to fill at least half of your plate with fruits and vegetables. ? Whole grains, such as whole-wheat bread, brown rice, and quinoa. ? Lean meats, such as chicken or fish. ? Other healthy proteins, such as beans, eggs, or tofu. ? Healthy fats, such as nuts, seeds, fatty fish, and olive oil. ? Low-fat or fat-free dairy products.  Check food labels, and avoid food and drinks that: ? Are high in calories. ? Have added sugar. ? Are high in sodium. ? Have saturated fats or trans fats.  Cook foods in healthier ways, such as by baking, broiling, or grilling.  Make a meal plan for the week, and shop with a grocery list to help you stay on track with your purchases. Try to avoid going to the grocery store when you are hungry.  When grocery shopping, try to shop around the outside of the store first, where the fresh foods are. Doing this helps you to avoid prepackaged foods, which can be high in sugar, salt (sodium), and fat. What lifestyle changes can be made?   Exercise for 30 or more minutes on 5 or more days each week. Exercising may include brisk walking, yard work, biking, running, swimming, and team sports like basketball and soccer. Ask your health care provider which exercises are safe for you.  Do muscle-strengthening activities, such as lifting weights or using resistance bands, on 2 or more days a week.  Do not use any products that contain nicotine or tobacco, such as cigarettes and e-cigarettes. If you need help quitting, ask your health care provider.  Limit alcohol intake to no  more than 1 drink a day for nonpregnant women and 2 drinks a day for men. One drink equals 12 oz of beer, 5 oz of wine, or 1 oz of hard liquor.  Try to get 7-9 hours of sleep each night. What other changes can be made?  Keep a food and activity journal to keep track of: ? What you ate and how many calories you had. Remember to count the calories in sauces, dressings, and side dishes. ? Whether you were active, and what exercises you did. ? Your calorie, weight, and activity goals.  Check your weight regularly. Track any changes. If you notice you have gained weight, make changes to your diet or activity routine.  Avoid taking weight-loss medicines or supplements. Talk to your health care provider  before starting any new medicine or supplement.  Talk to your health care provider before trying any new diet or exercise plan. Why are these changes important? Eating healthy, staying active, and having healthy habits can help you to prevent obesity. Those changes also:  Help you manage stress and emotions.  Help you connect with friends and family.  Improve your self-esteem.  Improve your sleep.  Prevent long-term health problems. What can happen if changes are not made? Being obese or overweight can cause you to develop joint or bone problems, which can make it hard for you to stay active or do activities you enjoy. Being obese or overweight also puts stress on your heart and lungs and can lead to health problems like diabetes, heart disease, and some cancers. Where to find more information Talk with your health care provider or a dietitian about healthy eating and healthy lifestyle choices. You may also find information from:  U.S. Department of Agriculture, MyPlate: FormerBoss.no  American Heart Association: www.heart.org  Centers for Disease Control and Prevention: http://www.wolf.info/ Summary  Staying at a healthy weight is important to your overall health. It helps you to  prevent certain diseases and health problems, such as heart disease, diabetes, joint problems, sleep disorders, and some types of cancer.  Being obese or overweight can cause you to develop joint or bone problems, which can make it hard for you to stay active or do activities you enjoy.  You can prevent unhealthy weight gain by eating a healthy diet, exercising regularly, not smoking, limiting alcohol, and getting enough sleep.  Talk with your health care provider or a dietitian for guidance about healthy eating and healthy lifestyle choices. This information is not intended to replace advice given to you by your health care provider. Make sure you discuss any questions you have with your health care provider. Document Released: 04/18/2016 Document Revised: 01/26/2017 Document Reviewed: 05/24/2016 Elsevier Interactive Patient Education  2019 [employer].  Try to follow the DASH guidelines (DASH stands for Dietary Approaches to Stop Hypertension). Try to limit the sodium in your diet to no more than 1,500mg  of sodium per day. Certainly try to not exceed 2,000 mg per day at the very most. Do not add salt when cooking or at the table.  Check the sodium amount on labels when shopping, and choose items lower in sodium when given a choice. Avoid or limit foods that already contain a lot of sodium. Eat a diet rich in fruits and vegetables and whole grains, and try to lose weight if overweight or obese

## 2018-05-23 NOTE — Assessment & Plan Note (Signed)
Encouraged weight loss, check LFTs today

## 2018-05-23 NOTE — Assessment & Plan Note (Signed)
Managed by oncologist 

## 2018-05-23 NOTE — Assessment & Plan Note (Signed)
Per oncologist; no bleeding or burising

## 2018-05-23 NOTE — Assessment & Plan Note (Signed)
Check A1c. 

## 2018-05-23 NOTE — Assessment & Plan Note (Signed)
Encouraged weight loss; see AVS 

## 2018-05-23 NOTE — Assessment & Plan Note (Signed)
Continue medicines; offered counseling, not needed at this time he says

## 2018-05-23 NOTE — Progress Notes (Signed)
Kenneth Moreno, please let the patient know that his CBC is normal; other labs pending at the time of this note

## 2018-05-23 NOTE — Progress Notes (Signed)
BP 124/78   Pulse 73   Temp 98 F (36.7 C)   Ht 6' (1.829 m)   Wt 269 lb (122 kg)   SpO2 95%   BMI 36.48 kg/m    Subjective:    Patient ID: Kenneth Moreno, male    DOB: 06/27/1970, 48 y.o.   MRN: [id number]  HPI: Kenneth Moreno is a 48 y.o. male  Chief Complaint  Patient presents with  . Medication Refill    HPI Here for f/u Elevated blood presure, but always in the 120s; father had HTN  Obesity; he would like to weigh 180 or 190 pounds; food is the issue; does a lot of walking; appetite too good; late evening is the toughest time, eats at night mostly; drinks lots of water  High cholesterol; high TG; some starches; not too many fried foods; one reading of 712 in 2017; taking cholesterol medicine, but ran out about 50 days ago  Vitamin D deficiency, level was 16 last year; energy level okay   Anxiety alone; the hydroxyzine helps; taking paxil 40 mg daily; no issues  Flu shot   Lymphoma of the spleen; 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; going back in a few months; splenomegaly; no easy bruising or bleeding  Fatty liver; last LFTs normal   Depression screen [facility] 2/9 05/23/2018 08/15/2017 07/02/2017 04/06/2017 09/04/2016  Decreased Interest 0 0 0 0 0  Down, Depressed, Hopeless 0 0 0 0 0  PHQ - 2 Score 0 0 0 0 0  Altered sleeping 0 - - - -  Tired, decreased energy 0 - - - -  Change in appetite 0 - - - -  Feeling bad or failure about yourself  0 - - - -  Trouble concentrating 0 - - - -  Moving slowly or fidgety/restless 0 - - - -  Suicidal thoughts 0 - - - -  PHQ-9 Score 0 - - - -  Difficult doing work/chores Not difficult at all - - - -   Fall Risk  05/23/2018 08/15/2017 07/02/2017 04/06/2017 09/04/2016  Falls in the past year? 0 No No No No    Relevant past medical, surgical, family and social history reviewed Past Medical History:  Diagnosis Date  . Airway hyperreactivity   . Anxiety   . Asthma   . Depression   . Hematuria   . HLD (hyperlipidemia)   .  Hypertriglyceridemia   . Kidney stone on right side   . Microscopic hematuria   . Obesity   . Post-nasal drainage   . Renal disorder   . Restless leg   . Right ureteral stone   . RUQ pain   . Sleep apnea   . Splenomegaly   . Vertigo, benign positional    Past Surgical History:  Procedure Laterality Date  . ganglion cyst removal  2006  . LITHOTRIPSY    . SINUS SURGERY WITH [place]  . TONSILLECTOMY  1990   Family History  Problem Relation Age of Onset  . Colon cancer Brother   . Colon cancer Maternal Uncle   . Diabetes Mellitus II Father   . Hyperlipidemia Father   . COPD Father   . Hypertension Daughter   . Prostate cancer Paternal Uncle   . Gallbladder disease Mother   . Hearing loss Mother        one of her ears. pt not sure which one  . Panic disorder Sister   . Hyperlipidemia Maternal Grandmother   .  Gallbladder disease Maternal Grandmother   . Hyperlipidemia Maternal Grandfather   . Congestive Heart Failure Paternal Grandfather   . Hyperlipidemia Paternal Grandfather   . Diabetes Paternal Grandfather   . Kidney disease Neg Hx    Social History   Tobacco Use  . Smoking status: Never Smoker  . Smokeless tobacco: Never Used  Substance Use Topics  . Alcohol use: No  . Drug use: No     [facility] Visit from 05/23/2018 in [facility]  AUDIT-C Score  0      Interim medical history since last visit reviewed. Allergies and medications reviewed  Review of Systems Per HPI unless specifically indicated above     Objective:    BP 124/78   Pulse 73   Temp 98 F (36.7 C)   Ht 6' (1.829 m)   Wt 269 lb (122 kg)   SpO2 95%   BMI 36.48 kg/m   Wt Readings from Last 3 Encounters:  05/23/18 269 lb (122 kg)  08/15/17 269 lb 14.4 oz (122.4 kg)  07/06/17 269 lb (122 kg)    Physical Exam Constitutional:      General: He is not in acute distress.    Appearance: He is well-developed. He is obese.  HENT:     Head: Normocephalic and  atraumatic.  Eyes:     General: No scleral icterus. Neck:     Thyroid: No thyromegaly.  Cardiovascular:     Rate and Rhythm: Normal rate and regular rhythm.  Pulmonary:     Effort: Pulmonary effort is normal.     Breath sounds: Normal breath sounds.  Abdominal:     General: Bowel sounds are normal. There is no distension.     Palpations: Abdomen is soft.  Skin:    General: Skin is warm and dry.     Coloration: Skin is not pale.  Neurological:     Coordination: Coordination normal.  Psychiatric:        Behavior: Behavior normal.        Thought Content: Thought content normal.        Judgment: Judgment normal.     Results for orders placed or performed in visit on 07/06/17  Multiple Myeloma Panel (SPEP&IFE w/QIG)  Result Value Ref Range   IgG (Immunoglobin G), Serum 989 700 - 1,600 mg/dL   IgA 118 90 - 386 mg/dL   IgM (Immunoglobulin M), Srm 21 20 - 172 mg/dL   Total Protein ELP 6.7 6.0 - 8.5 g/dL   Albumin SerPl Elph-Mcnc 3.9 2.9 - 4.4 g/dL   Alpha 1 0.2 0.0 - 0.4 g/dL   Alpha2 Glob SerPl Elph-Mcnc 0.6 0.4 - 1.0 g/dL   B-Globulin SerPl Elph-Mcnc 1.0 0.7 - 1.3 g/dL   Gamma Glob SerPl Elph-Mcnc 0.9 0.4 - 1.8 g/dL   M Protein SerPl Elph-Mcnc Not Observed Not Observed g/dL   Globulin, Total 2.8 2.2 - 3.9 g/dL   Albumin/Glob SerPl 1.4 0.7 - 1.7   IFE 1 Comment    Please Note Comment   Comprehensive metabolic panel  Result Value Ref Range   Sodium 137 135 - 145 mmol/L   Potassium 4.1 3.5 - 5.1 mmol/L   Chloride 102 101 - 111 mmol/L   CO2 22 22 - 32 mmol/L   Glucose, Bld 100 (H) 65 - 99 mg/dL   BUN 16 6 - 20 mg/dL   Creatinine, Ser 1.10 0.61 - 1.24 mg/dL   Calcium 9.5 8.9 - 10.3 mg/dL   Total Protein  7.3 6.5 - 8.1 g/dL   Albumin 4.4 3.5 - 5.0 g/dL   AST 26 15 - 41 U/L   ALT 45 17 - 63 U/L   Alkaline Phosphatase 49 38 - 126 U/L   Total Bilirubin 0.8 0.3 - 1.2 mg/dL   GFR calc non Af Amer >60 >60 mL/min   GFR calc Af Amer >60 >60 mL/min   Anion gap 13 5 - 15  CBC  with Differential/Platelet  Result Value Ref Range   WBC 7.2 3.8 - 10.6 K/uL   RBC 5.52 4.40 - 5.90 MIL/uL   Hemoglobin 15.9 13.0 - 18.0 g/dL   HCT 45.5 40.0 - 52.0 %   MCV 82.3 80.0 - 100.0 fL   MCH 28.8 26.0 - 34.0 pg   MCHC 34.9 32.0 - 36.0 g/dL   RDW 14.3 11.5 - 14.5 %   Platelets 241 150 - 440 K/uL   Neutrophils Relative % 57 %   Neutro Abs 4.1 1.4 - 6.5 K/uL   Lymphocytes Relative 31 %   Lymphs Abs 2.2 1.0 - 3.6 K/uL   Monocytes Relative 9 %   Monocytes Absolute 0.7 0.2 - 1.0 K/uL   Eosinophils Relative 2 %   Eosinophils Absolute 0.2 0 - 0.7 K/uL   Basophils Relative 1 %   Basophils Absolute 0.1 0 - 0.1 K/uL  Lactate dehydrogenase  Result Value Ref Range   LDH 118 98 - 192 U/L      Assessment & Plan:   Problem List Items Addressed This Visit      Digestive   Fatty liver (Chronic)    Encouraged weight loss, check LFTs today      Relevant Orders   COMPLETE METABOLIC PANEL WITH GFR     Other   Splenomegaly    Per oncologist; no bleeding or burising      Relevant Orders   CBC   COMPLETE METABOLIC PANEL WITH GFR   Obesity (BMI 35.0-39.9 without comorbidity)    Encouraged weight loss; see AVS      Lymphoma ([place])    Managed by oncologist      Hypertriglyceridemia (Chronic)    Check lipids today; fasting since 8 pm last night (truly fasting); limit straches and fried foods      Relevant Medications   omega-3 acid ethyl esters (LOVAZA) 1 g capsule   Other Relevant Orders   Lipid panel   Hyperglycemia (Chronic)    Check A1c      Relevant Orders   Hemoglobin A1c   Anxiety, generalized    Continue medicines; offered counseling, not needed at this time he says      Relevant Medications   PARoxetine (PAXIL) 40 MG tablet    Other Visit Diagnoses    Need for influenza vaccination    -  Primary   Relevant Orders   Flu Vaccine QUAD 6+ mos PF IM (Fluarix Quad PF) (Completed)   Vitamin D deficiency       Relevant Orders   VITAMIN D 25 Hydroxy (Vit-D  Deficiency, Fractures)       Follow up plan: Return in about 6 months (around 11/21/2018) for follow-up visit with Dr. Sanda Klein.  An after-visit summary was printed and given to the patient at [place].  Please see the patient instructions which may contain other information and recommendations beyond what is mentioned above in the assessment and plan.  Meds ordered this encounter  Medications  . omega-3 acid ethyl esters (LOVAZA) 1 g capsule  Sig: Take 2 capsules (2 g total) by mouth 2 (two) times daily.    Dispense:  120 capsule    Refill:  11  . PARoxetine (PAXIL) 40 MG tablet    Sig: TAKE 1 TABLET BY MOUTH EVERY DAY IN THE MORNING    Dispense:  90 tablet    Refill:  3    Orders Placed This Encounter  Procedures  . Flu Vaccine QUAD 6+ mos PF IM (Fluarix Quad PF)  . CBC  . COMPLETE METABOLIC PANEL WITH GFR  . Hemoglobin A1c  . Lipid panel  . VITAMIN D 25 Hydroxy (Vit-D Deficiency, Fractures)

## 2018-05-24 ENCOUNTER — Other Ambulatory Visit: Payer: Self-pay | Admitting: Family Medicine

## 2018-05-24 LAB — COMPLETE METABOLIC PANEL WITH GFR
AG Ratio: 2 (calc) (ref 1.0–2.5)
ALT: 39 U/L (ref 9–46)
AST: 22 U/L (ref 10–40)
Albumin: 4.6 g/dL (ref 3.6–5.1)
Alkaline phosphatase (APISO): 52 U/L (ref 40–115)
BILIRUBIN TOTAL: 0.5 mg/dL (ref 0.2–1.2)
BUN: 14 mg/dL (ref 7–25)
CO2: 26 mmol/L (ref 20–32)
Calcium: 9.6 mg/dL (ref 8.6–10.3)
Chloride: 104 mmol/L (ref 98–110)
Creat: 0.99 mg/dL (ref 0.60–1.35)
GFR, Est African American: 103 mL/min/{1.73_m2} (ref >=60)
GFR, Est Non African American: 89 mL/min/{1.73_m2} (ref >=60)
Globulin: 2.3 g/dL (calc) (ref 1.9–3.7)
Glucose, Bld: 104 mg/dL — ABNORMAL HIGH (ref 65–99)
Potassium: 4.8 mmol/L (ref 3.5–5.3)
Sodium: 139 mmol/L (ref 135–146)
Total Protein: 6.9 g/dL (ref 6.1–8.1)

## 2018-05-24 LAB — LIPID PANEL
Cholesterol: 192 mg/dL (ref <200)
HDL: 34 mg/dL — ABNORMAL LOW (ref >40)
LDL Cholesterol (Calc): 122 mg/dL (calc) — ABNORMAL HIGH
Non-HDL Cholesterol (Calc): 158 mg/dL (calc) — ABNORMAL HIGH (ref <130)
TRIGLYCERIDES: 254 mg/dL — AB (ref <150)
Total CHOL/HDL Ratio: 5.6 (calc) — ABNORMAL HIGH (ref <5.0)

## 2018-05-24 LAB — HEMOGLOBIN A1C
Hgb A1c MFr Bld: 5.7 % of total Hgb — ABNORMAL HIGH (ref <5.7)
Mean Plasma Glucose: 117 (calc)
eAG (mmol/L): 6.5 (calc)

## 2018-05-24 LAB — CBC
HCT: 48 % (ref 38.5–50.0)
Hemoglobin: 16.4 g/dL (ref 13.2–17.1)
MCH: 28.6 pg (ref 27.0–33.0)
MCHC: 34.2 g/dL (ref 32.0–36.0)
MCV: 83.6 fL (ref 80.0–100.0)
MPV: 11.4 fL (ref 7.5–12.5)
PLATELETS: 268 10*3/uL (ref 140–400)
RBC: 5.74 10*6/uL (ref 4.20–5.80)
RDW: 13 % (ref 11.0–15.0)
WBC: 6.8 10*3/uL (ref 3.8–10.8)

## 2018-05-24 LAB — VITAMIN D 25 HYDROXY (VIT D DEFICIENCY, FRACTURES): Vit D, 25-Hydroxy: 18 ng/mL — ABNORMAL LOW (ref 30–100)

## 2018-05-24 MED ORDER — VITAMIN D (ERGOCALCIFEROL) 1.25 MG (50000 UNIT) PO CAPS: 50000.0000 [IU] | ORAL_CAPSULE | ORAL | 1 refills | Status: DC

## 2018-05-24 NOTE — Progress Notes (Signed)
Joelene Millin, please also let the patient know that his glucose and A1c are in the "prediabetes" range; weight loss is the single most important thing he can do to prevent developing diabetes in the future His cholesterol is abnormal; HDL too low, TG high (but better), and LDL high; really work on weight loss and healthy eating; continue Lovaza; does he want to add a low dose statin or really buckle down on lifestyle? Vit D is low, so let's add Rx vitamin D once a week for 8 weeks, then have him take [id number] iu vit D3 once a day when not out in the sun for more than 20-30 minutes a day The 10-year ASCVD risk score Mikey Bussing DC Brooke Bonito., et al., 2013) is: 4.2%   Values used to calculate the score:     Age: 48 years     Sex: Male     Is Non-Hispanic African American: No     Diabetic: No     Tobacco smoker: No     Systolic Blood Pressure: 641 mmHg     Is BP treated: No     HDL Cholesterol: 34 mg/dL     Total Cholesterol: 192 mg/dL

## 2018-05-24 NOTE — Progress Notes (Signed)
rx vit D Offered low dose statin

## 2018-05-27 ENCOUNTER — Telehealth: Payer: Self-pay

## 2018-05-27 NOTE — Telephone Encounter (Signed)
Got a fax from [facility] stating that this patient needs a new RX b/c Lovaza is not covered under his insurance.  Please send in new RX.

## 2018-05-28 MED ORDER — ICOSAPENT ETHYL 1 G PO CAPS: ORAL_CAPSULE | ORAL | 11 refills | Status: DC

## 2018-05-28 NOTE — Telephone Encounter (Signed)
New Rx for vascepa sent in

## 2018-06-22 ENCOUNTER — Other Ambulatory Visit: Payer: Self-pay | Admitting: Family Medicine

## 2018-07-01 ENCOUNTER — Other Ambulatory Visit: Payer: Self-pay

## 2018-08-23 ENCOUNTER — Encounter: Payer: Self-pay | Admitting: Family Medicine

## 2018-11-07 ENCOUNTER — Encounter: Payer: Self-pay | Admitting: Family Medicine

## 2018-11-11 ENCOUNTER — Encounter: Payer: Self-pay | Admitting: Nurse Practitioner

## 2018-11-11 ENCOUNTER — Other Ambulatory Visit: Payer: Self-pay

## 2018-11-11 ENCOUNTER — Ambulatory Visit (INDEPENDENT_AMBULATORY_CARE_PROVIDER_SITE_OTHER): Payer: BLUE CROSS/BLUE SHIELD | Admitting: Nurse Practitioner

## 2018-11-11 VITALS — BP 108/78 | HR 85 | Temp 97.5°F | Resp 14 | Ht 72.0 in | Wt 268.9 lb

## 2018-11-11 DIAGNOSIS — G4733 Obstructive sleep apnea (adult) (pediatric): Secondary | ICD-10-CM

## 2018-11-11 DIAGNOSIS — R7303 Prediabetes: Secondary | ICD-10-CM

## 2018-11-11 DIAGNOSIS — K760 Fatty (change of) liver, not elsewhere classified: Secondary | ICD-10-CM

## 2018-11-11 DIAGNOSIS — F411 Generalized anxiety disorder: Secondary | ICD-10-CM

## 2018-11-11 DIAGNOSIS — Z114 Encounter for screening for human immunodeficiency virus [HIV]: Secondary | ICD-10-CM

## 2018-11-11 DIAGNOSIS — E559 Vitamin D deficiency, unspecified: Secondary | ICD-10-CM

## 2018-11-11 DIAGNOSIS — M51379 Other intervertebral disc degeneration, lumbosacral region without mention of lumbar back pain or lower extremity pain: Secondary | ICD-10-CM | POA: Insufficient documentation

## 2018-11-11 DIAGNOSIS — M5136 Other intervertebral disc degeneration, lumbar region: Secondary | ICD-10-CM | POA: Insufficient documentation

## 2018-11-11 DIAGNOSIS — M5137 Other intervertebral disc degeneration, lumbosacral region: Secondary | ICD-10-CM | POA: Insufficient documentation

## 2018-11-11 DIAGNOSIS — E785 Hyperlipidemia, unspecified: Secondary | ICD-10-CM

## 2018-11-11 NOTE — Progress Notes (Signed)
Name: Kenneth Moreno   MRN: [id number]    DOB: 05/30/1970   Date:11/11/2018       Progress Note  Subjective  Chief Complaint  Chief Complaint  Patient presents with  . Follow-up    HPI  Fatty liver disease Diet: has been doing home cooked meals since the pandemic, lots of garden vegetables. Baked and bbq chicken, still eating a good amount of carbs.  Wt Readings from Last 3 Encounters:  11/11/18 268 lb 14.4 oz (122 kg)  05/23/18 269 lb (122 kg)  08/15/17 269 lb 14.4 oz (122.4 kg)   Hyperlipidemia Patient rx vascepa 2 grams BID. Takes medications as prescribed with no missed doses a month.  Denies myalgias Lab Results  Component Value Date   CHOL 192 05/23/2018   HDL 34 (L) 05/23/2018   LDLCALC 122 (H) 05/23/2018   TRIG 254 (H) 05/23/2018   CHOLHDL 5.6 (H) 05/23/2018   History of Splenomegaly  Followed by oncology- 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 who discussed concerning signs and symptoms to report and follow-up PRN as last CT scan 07/11/2017 showed normal spleen.   Sleep apnea - states he had a sleep study over 5 years ago states they said he had a mild form but did not require CPAP- had ENT surgery 10 years ago and has not had issues since.   Anxiety Takes paxil 40mg  daily has been on this for over 10 years. States even with all that has been going on with losing work and the pandemic has been well-controlled. Has a strong family history of anxiety. Was on daily xanax in the past.   PHQ2/9: Depression screen [facility] 2/9 11/11/2018 05/23/2018 08/15/2017 07/02/2017 04/06/2017  Decreased Interest 0 0 0 0 0  Down, Depressed, Hopeless 0 0 0 0 0  PHQ - 2 Score 0 0 0 0 0  Altered sleeping 0 0 - - -  Tired, decreased energy 0 0 - - -  Change in appetite 0 0 - - -  Feeling bad or failure about yourself  0 0 - - -  Trouble concentrating 0 0 - - -  Moving slowly or fidgety/restless 0 0 - - -  Suicidal thoughts 0 0 - - -  PHQ-9 Score 0 0 - - -  Difficult doing work/chores Not difficult at all Not  difficult at all - - -    PHQ reviewed. Negative  Patient Active Problem List   Diagnosis Date Noted  . Fatty liver 08/15/2017  . Benign paroxysmal positional nystagmus 07/02/2017  . Apnea, sleep 07/02/2017  . Obesity (BMI 35.0-39.9 without comorbidity) 07/02/2017  . Hyperglycemia 04/03/2016  . Splenomegaly 01/25/2015  . Gastrointestinal complaints 12/21/2014  . Anxiety, generalized 12/21/2014  . Dyslipidemia 12/21/2014    Past Medical History:  Diagnosis Date  . Airway hyperreactivity   . Anxiety   . Asthma   . Depression   . Hematuria   . HLD (hyperlipidemia)   . Hypertriglyceridemia   . Kidney stone on right side   . Kidney stones 09/01/2015  . Microscopic hematuria   . Obesity   . Post-nasal drainage   . Renal disorder   . Restless leg   . Right ureteral stone   . RUQ pain   . Sleep apnea   . Splenomegaly   . Vertigo, benign positional     Past Surgical History:  Procedure Laterality Date  . ganglion cyst removal  2006  . LITHOTRIPSY    . SINUS SURGERY WITH [place]  .  TONSILLECTOMY  1990    Social History   Tobacco Use  . Smoking status: Never Smoker  . Smokeless tobacco: Never Used  Substance Use Topics  . Alcohol use: No     Current Outpatient Medications:  .  hydrOXYzine (VISTARIL) 25 MG capsule, Take 1 capsule (25 mg total) by mouth every 6 (six) hours as needed. Do not drive for 6 hours after taking this medicine, Disp: 30 capsule, Rfl: 1 .  Icosapent Ethyl (VASCEPA) 1 g CAPS, Two capsules by mouth twice a day (to help with triglycerides), Disp: 120 capsule, Rfl: 11 .  PARoxetine (PAXIL) 40 MG tablet, TAKE 1 TABLET BY MOUTH EVERY DAY IN THE MORNING, Disp: 90 tablet, Rfl: 3 .  Vitamin D, Ergocalciferol, (DRISDOL) 1.25 MG (50000 UT) CAPS capsule, Take 1 capsule (50,000 Units total) by mouth every 7 (seven) days. (Patient not taking: Reported on 11/11/2018), Disp: 4 capsule, Rfl: 1  Allergies  Allergen Reactions  . Penicillins Diarrhea     Review of Systems  Constitutional: Negative for chills, fever and malaise/fatigue.  HENT: Negative for congestion, sinus pain and sore throat.   Eyes: Negative for blurred vision.  Respiratory: Negative for cough and shortness of breath.   Cardiovascular: Negative for chest pain, palpitations and leg swelling.  Gastrointestinal: Negative for abdominal pain, constipation, diarrhea and nausea.  Genitourinary: Negative for dysuria.  Musculoskeletal: Negative for falls and joint pain.  Skin: Negative for rash.  Neurological: Negative for dizziness and headaches.  Endo/Heme/Allergies: Negative for polydipsia.  Psychiatric/Behavioral: The patient is not nervous/anxious and does not have insomnia.       No other specific complaints in a complete review of systems (except as listed in HPI above).  Objective  Vitals:   11/11/18 1137  BP: 108/78  Pulse: 85  Resp: 14  Temp: (!) 97.5 F (36.4 C)  SpO2: 94%  Weight: 268 lb 14.4 oz (122 kg)  Height: 6' (1.829 m)     Body mass index is 36.47 kg/m.  Nursing Note and Vital Signs reviewed.  Physical Exam Vitals signs reviewed.  Constitutional:      Appearance: He is well-developed.  HENT:     Head: Normocephalic and atraumatic.  Neck:     Musculoskeletal: Normal range of motion and neck supple.     Vascular: No carotid bruit.  Cardiovascular:     Heart sounds: Normal heart sounds.  Pulmonary:     Effort: Pulmonary effort is normal.     Breath sounds: Normal breath sounds.  Abdominal:     General: Bowel sounds are normal.     Palpations: Abdomen is soft.     Tenderness: There is no abdominal tenderness.  Musculoskeletal: Normal range of motion.  Skin:    General: Skin is warm and dry.     Capillary Refill: Capillary refill takes less than 2 seconds.  Neurological:     Mental Status: He is alert and oriented to person, place, and time.     GCS: GCS eye subscore is 4. GCS verbal subscore is 5. GCS motor subscore is 6.      Sensory: No sensory deficit.  Psychiatric:        Speech: Speech normal.        Behavior: Behavior normal.        Thought Content: Thought content normal.        Judgment: Judgment normal.        No results found for this or any previous visit (from the past 74  hour(s)).  Assessment & Plan  1. Dyslipidemia - Lipid Profile  2. Prediabetes - HgB A1c - COMPLETE METABOLIC PANEL WITH GFR  3. Anxiety, generalized Controlled.   4. Obstructive sleep apnea syndrome Resolved with surgery   5. Fatty liver - COMPLETE METABOLIC PANEL WITH GFR  6. Degenerative disc disease at L5-S1 level Mild, uses tylenol PRN- very rarely   7. Vitamin D deficiency - OTC supplementation   8. Screening for HIV (human immunodeficiency virus) - HIV antibody (with reflex)    -Red flags and when to present for emergency care or RTC including fever >101.65F, chest pain, shortness of breath, new/worsening/un-resolving symptoms, reviewed with patient at time of visit. Follow up and care instructions discussed and provided in AVS.

## 2018-11-11 NOTE — Patient Instructions (Addendum)
Bad cholesterol, also called low-density lipoprotein (LDL), carries cholesterol and other fats that your liver makes to your body tissue. If it builds up in blood vessels, LDL can cause heart disease and other health problems. Your LDL level should be below 100. If you have diabetes or a possible heart problem, your LDL should be below 70.  Eat: Eat 20 to 30 grams of soluble fiber every day. Foods such as fruits and vegetables, whole grains, beans, peas, nuts, and seeds can help lower LDL. Avoid: Saturated fats (Dairy foods - such as butter, cream, ghee, regular-fat milk and cheese. Meat - such as fatty cuts of beef, pork and lamb, processed meats like salami, sausages and the skin on chicken. Lard., fatty snack foods, cakes, biscuits, pies and deep fried foods) Avoid smoking  Work on weight loss by decreasing portion sizes- increasing fiber to fill you up faster and drink plenty of water. Slowly work on increasing activities.   - please make sure you are having 800-2000IU of vitamin D a day or around 7,000-14,000 IU/ week.    Prediabetes Eating Plan Prediabetes is a condition that causes blood sugar (glucose) levels to be higher than normal. This increases the risk for developing diabetes. In order to prevent diabetes from developing, your health care provider may recommend a diet and other lifestyle changes to help you:  Control your blood glucose levels.  Improve your cholesterol levels.  Manage your blood pressure. Your health care provider may recommend working with a diet and nutrition specialist (dietitian) to make a meal plan that is best for you. What are tips for following this plan? Lifestyle  Set weight loss goals with the help of your health care team. It is recommended that most people with prediabetes lose 7% of their current body weight.  Exercise for at least 30 minutes at least 5 days a week.  Attend a support group or seek ongoing support from a mental health  counselor.  Take over-the-counter and prescription medicines only as told by your health care provider. Reading food labels  Read food labels to check the amount of fat, salt (sodium), and sugar in prepackaged foods. Avoid foods that have: ? Saturated fats. ? Trans fats. ? Added sugars.  Avoid foods that have more than 300 milligrams (mg) of sodium per serving. Limit your daily sodium intake to less than 2,300 mg each day. Shopping  Avoid buying pre-made and processed foods. Cooking  Cook with olive oil. Do not use butter, lard, or ghee.  Bake, broil, grill, or boil foods. Avoid frying. Meal planning   Work with your dietitian to develop an eating plan that is right for you. This may include: ? Tracking how many calories you take in. Use a food diary, notebook, or mobile application to track what you eat at each meal. ? Using the glycemic index (GI) to plan your meals. The index tells you how quickly a food will raise your blood glucose. Choose low-GI foods. These foods take a longer time to raise blood glucose.  Consider following a Mediterranean diet. This diet includes: ? Several servings each day of fresh fruits and vegetables. ? Eating fish at least twice a week. ? Several servings each day of whole grains, beans, nuts, and seeds. ? Using olive oil instead of other fats. ? Moderate alcohol consumption. ? Eating small amounts of red meat and whole-fat dairy.  If you have high blood pressure, you may need to limit your sodium intake or follow a diet  such as the DASH eating plan. DASH is an eating plan that aims to lower high blood pressure. What foods are recommended? The items listed below may not be a complete list. Talk with your dietitian about what dietary choices are best for you. Grains Whole grains, such as whole-wheat or whole-grain breads, crackers, cereals, and pasta. Unsweetened oatmeal. Bulgur. Barley. Quinoa. Brown rice. Corn or whole-wheat flour tortillas or  taco shells. Vegetables Lettuce. Spinach. Peas. Beets. Cauliflower. Cabbage. Broccoli. Carrots. Tomatoes. Squash. Eggplant. Herbs. Peppers. Onions. Cucumbers. Brussels sprouts. Fruits Berries. Bananas. Apples. Oranges. Grapes. Papaya. Mango. Pomegranate. Kiwi. Grapefruit. Cherries. Meats and other protein foods Seafood. Poultry without skin. Lean cuts of pork and beef. Tofu. Eggs. Nuts. Beans. Dairy Low-fat or fat-free dairy products, such as yogurt, cottage cheese, and cheese. Beverages Water. Tea. Coffee. Sugar-free or diet soda. Seltzer water. Lowfat or no-fat milk. Milk alternatives, such as soy or almond milk. Fats and oils Olive oil. Canola oil. Sunflower oil. Grapeseed oil. Avocado. Walnuts. Sweets and desserts Sugar-free or low-fat pudding. Sugar-free or low-fat ice cream and other frozen treats. Seasoning and other foods Herbs. Sodium-free spices. Mustard. Relish. Low-fat, low-sugar ketchup. Low-fat, low-sugar barbecue sauce. Low-fat or fat-free mayonnaise. What foods are not recommended? The items listed below may not be a complete list. Talk with your dietitian about what dietary choices are best for you. Grains Refined white flour and flour products, such as bread, pasta, snack foods, and cereals. Vegetables Canned vegetables. Frozen vegetables with butter or cream sauce. Fruits Fruits canned with syrup. Meats and other protein foods Fatty cuts of meat. Poultry with skin. Breaded or fried meat. Processed meats. Dairy Full-fat yogurt, cheese, or milk. Beverages Sweetened drinks, such as sweet iced tea and soda. Fats and oils Butter. Lard. Ghee. Sweets and desserts Baked goods, such as cake, cupcakes, pastries, cookies, and cheesecake. Seasoning and other foods Spice mixes with added salt. Ketchup. Barbecue sauce. Mayonnaise. Summary  To prevent diabetes from developing, you may need to make diet and other lifestyle changes to help control blood sugar, improve  cholesterol levels, and manage your blood pressure.  Set weight loss goals with the help of your health care team. It is recommended that most people with prediabetes lose 7 percent of their current body weight.  Consider following a Mediterranean diet that includes plenty of fresh fruits and vegetables, whole grains, beans, nuts, seeds, fish, lean meat, low-fat dairy, and healthy oils. This information is not intended to replace advice given to you by your health care provider. Make sure you discuss any questions you have with your health care provider. Document Released: 09/01/2014 Document Revised: 08/09/2018 Document Reviewed: 06/21/2016 Elsevier Patient Education  2020 [employer].

## 2018-11-11 NOTE — Assessment & Plan Note (Signed)
states he had a sleep study over 5 years ago states they said he had a mild form but did not require CPAP- had ENT surgery 10 years ago and has not had issues since.

## 2018-11-13 LAB — COMPLETE METABOLIC PANEL WITH GFR
AG Ratio: 1.8 (calc) (ref 1.0–2.5)
ALT: 30 U/L (ref 9–46)
AST: 14 U/L (ref 10–40)
Albumin: 4.4 g/dL (ref 3.6–5.1)
Alkaline phosphatase (APISO): 52 U/L (ref 36–130)
BUN: 14 mg/dL (ref 7–25)
CO2: 29 mmol/L (ref 20–32)
Calcium: 9.7 mg/dL (ref 8.6–10.3)
Chloride: 102 mmol/L (ref 98–110)
Creat: 1.06 mg/dL (ref 0.60–1.35)
GFR, Est African American: 95 mL/min/{1.73_m2} (ref >=60)
GFR, Est Non African American: 82 mL/min/{1.73_m2} (ref >=60)
Globulin: 2.5 g/dL (calc) (ref 1.9–3.7)
Glucose, Bld: 111 mg/dL — ABNORMAL HIGH (ref 65–99)
Potassium: 4.5 mmol/L (ref 3.5–5.3)
Sodium: 138 mmol/L (ref 135–146)
Total Bilirubin: 0.5 mg/dL (ref 0.2–1.2)
Total Protein: 6.9 g/dL (ref 6.1–8.1)

## 2018-11-13 LAB — HEMOGLOBIN A1C
Hgb A1c MFr Bld: 5.6 % of total Hgb (ref <5.7)
Mean Plasma Glucose: 114 (calc)
eAG (mmol/L): 6.3 (calc)

## 2018-11-13 LAB — HIV ANTIBODY (ROUTINE TESTING W REFLEX): HIV 1&2 Ab, 4th Generation: NONREACTIVE

## 2018-11-13 LAB — LIPID PANEL
Cholesterol: 209 mg/dL — ABNORMAL HIGH (ref <200)
HDL: 35 mg/dL — ABNORMAL LOW (ref >=40)
LDL Cholesterol (Calc): 121 mg/dL (calc) — ABNORMAL HIGH
Non-HDL Cholesterol (Calc): 174 mg/dL (calc) — ABNORMAL HIGH (ref <130)
Total CHOL/HDL Ratio: 6 (calc) — ABNORMAL HIGH (ref <5.0)
Triglycerides: 373 mg/dL — ABNORMAL HIGH (ref <150)

## 2018-11-21 ENCOUNTER — Ambulatory Visit: Payer: Self-pay | Admitting: Family Medicine

## 2019-05-15 ENCOUNTER — Other Ambulatory Visit: Payer: Self-pay

## 2019-05-15 ENCOUNTER — Encounter: Payer: Self-pay | Admitting: Family Medicine

## 2019-05-15 ENCOUNTER — Ambulatory Visit (INDEPENDENT_AMBULATORY_CARE_PROVIDER_SITE_OTHER): Payer: BLUE CROSS/BLUE SHIELD | Admitting: Family Medicine

## 2019-05-15 VITALS — BP 130/76 | HR 80 | Temp 97.8°F | Resp 18 | Ht 72.0 in | Wt 273.8 lb

## 2019-05-15 DIAGNOSIS — F411 Generalized anxiety disorder: Secondary | ICD-10-CM | POA: Diagnosis not present

## 2019-05-15 DIAGNOSIS — Z1212 Encounter for screening for malignant neoplasm of rectum: Secondary | ICD-10-CM

## 2019-05-15 DIAGNOSIS — Z131 Encounter for screening for diabetes mellitus: Secondary | ICD-10-CM

## 2019-05-15 DIAGNOSIS — E785 Hyperlipidemia, unspecified: Secondary | ICD-10-CM

## 2019-05-15 DIAGNOSIS — Z125 Encounter for screening for malignant neoplasm of prostate: Secondary | ICD-10-CM

## 2019-05-15 DIAGNOSIS — R399 Unspecified symptoms and signs involving the genitourinary system: Secondary | ICD-10-CM

## 2019-05-15 DIAGNOSIS — Z1159 Encounter for screening for other viral diseases: Secondary | ICD-10-CM

## 2019-05-15 DIAGNOSIS — Z1211 Encounter for screening for malignant neoplasm of colon: Secondary | ICD-10-CM

## 2019-05-15 DIAGNOSIS — Z0000 Encounter for general adult medical examination without abnormal findings: Secondary | ICD-10-CM

## 2019-05-15 MED ORDER — ICOSAPENT ETHYL 1 G PO CAPS: ORAL_CAPSULE | ORAL | 3 refills | Status: DC

## 2019-05-15 MED ORDER — HYDROXYZINE PAMOATE 25 MG PO CAPS: 25.0000 mg | ORAL_CAPSULE | Freq: Four times a day (QID) | ORAL | 1 refills | Status: DC | PRN

## 2019-05-15 MED ORDER — PAROXETINE HCL 40 MG PO TABS: ORAL_TABLET | ORAL | 1 refills | Status: DC

## 2019-05-15 NOTE — Progress Notes (Signed)
Name: Kenneth Moreno   MRN: [id number]    DOB: 12-18-1970   Date:Moreno       Progress Note  Subjective  Chief Complaint  Chief Complaint  Patient presents with  . Annual Exam  . Follow-up    HPI  Patient presents for annual CPE - new to me today.  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  Discussed adding exercise to daily routine.   Depression: phq 9 is negative - taking paxil (on for 25 years) daily and PRN hydroxyzine maybe once every few months PRN for anxiety/panic attacks. Depression screen Kenneth Moreno 2/9 Moreno 11/11/2018 05/23/2018 08/15/2017 07/02/2017  Decreased Interest Moreno Moreno Moreno Moreno Moreno  Down, Depressed, Hopeless Moreno Moreno Moreno Moreno Moreno  PHQ - 2 Score Moreno Moreno Moreno Moreno Moreno  Altered sleeping Moreno Moreno Moreno - -  Tired, decreased energy Moreno Moreno Moreno - -  Change in appetite Moreno Moreno Moreno - -  Feeling bad or failure about yourself  Moreno Moreno Moreno - -  Trouble concentrating Moreno Moreno Moreno - -  Moving slowly or fidgety/restless Moreno Moreno Moreno - -  Suicidal thoughts Moreno Moreno Moreno - -  PHQ-9 Score Moreno Moreno Moreno - -  Difficult doing work/chores Not difficult at all Not difficult at all Not difficult at all - -    Hypertension:  BP Readings from Last 3 Encounters:  05/15/19 130/76  11/11/18 108/78  05/23/18 124/78    Obesity: Wt Readings from Last 3 Encounters:  05/15/19 273 lb 12.8 oz (124.2 kg)  11/11/18 268 lb 14.4 oz (122 kg)  05/23/18 269 lb (122 kg)   BMI Readings from Last 3 Encounters:  05/15/19 37.13 kg/m  11/11/18 36.47 kg/m  05/23/18 36.48 kg/m     Lipids: Recheck today; taking vascepa for high triglycerides only. Lab Results  Component Value Date   CHOL 209 (H) 11/12/2018   CHOL 192 05/23/2018   CHOL 172 05/08/2017   Lab Results  Component Value Date   HDL 35 (L) 11/12/2018   HDL 34 (L) 05/23/2018   HDL 32 (L) 05/08/2017   Lab Results  Component Value Date   LDLCALC 121 (H) 11/12/2018   LDLCALC  122 (H) 05/23/2018   LDLCALC 93 05/08/2017   Lab Results  Component Value Date   TRIG 373 (H) 11/12/2018   TRIG 254 (H) 05/23/2018   TRIG 354 (H) 05/08/2017   Lab Results  Component Value Date   CHOLHDL 6.Moreno (H) 11/12/2018   CHOLHDL 5.6 (H) 05/23/2018   CHOLHDL 5.4 (H) 05/08/2017   No results found for: LDLDIRECT Glucose:  Glucose  Date Value Ref Range Status  04/22/2012 97 65 - 99 mg/dL Final   Glucose, Bld  Date Value Ref Range Status  11/12/2018 111 (H) 65 - 99 mg/dL Final    Comment:    .            Fasting reference interval . For someone without known diabetes, a glucose value between 100 and 125 mg/dL is consistent with prediabetes and should be confirmed with a follow-up test. .   05/23/2018 104 (H) 65 - 99 mg/dL Final    Comment:    .            Fasting reference interval . For someone without known diabetes, a glucose value between 100 and 125 mg/dL is consistent  with prediabetes and should be confirmed with a follow-up test. .   07/06/2017 100 (H) 65 - 99 mg/dL Final      Office Visit from Moreno in Kenneth Moreno  AUDIT-C Score  Moreno     Married STD testing and prevention (HIV/chl/gon/syphilis): Declines additional testing; HIV negative in July 2020 Hep C: Will obtain today  Skin cancer: Discussed monitoring for atypical lesions; discussed sunscreen.  No concerning lesions noted by patient Colorectal cancer: Has family history, had colonoscopy about 5 years ago with Kenneth Moreno - we will request records to see when repeat is recommended as patient is unsure. Denies personal history of colorectal cancer, no changes in BM's - no blood in stool, dark and tarry stool, mucus in stool, or constipation/diarrhea.  Prostate cancer: No family history Lab Results  Component Value Date   PSA Moreno.3 05/08/2017   IPSS Questionnaire (AUA-7): Over the past month.   1)  How often have you had a sensation of not emptying your bladder completely  after you finish urinating?  3 - About half the time  2)  How often have you had to urinate again less than two hours after you finished urinating? 2 - Less than half the time  3)  How often have you found you stopped and started again several times when you urinated?  Moreno - Not at all  4) How difficult have you found it to postpone urination?  5 - Almost always  5) How often have you had a weak urinary stream?  2 - Less than half the time  6) How often have you had to push or strain to begin urination?  Moreno - Not at all  7) How many times did you most typically get up to urinate from the time you went to bed until the time you got up in the morning?  3 - 3 times  Total score:  Moreno-7 mildly symptomatic   8-19 moderately symptomatic   20-35 severely symptomatic  Score of 15.  Discussed checking PSA - we will check today and if WNL, will start him on flomax  Lung cancer: Never smoker Low Dose CT Chest recommended if Age 67-80 years, 30 pack-year currently smoking OR have quit w/in 15years. Patient does not qualify.   AAA: N/A The USPSTF recommends one-time screening with ultrasonography in men ages 61 to 47 years who have ever smoked ECG:  Denies chest pain, shortness of breath, or palpitations  Advanced Care Planning: A voluntary discussion about advance care planning including the explanation and discussion of advance directives.  Discussed health care proxy and Living will, and the patient was able to identify a health care proxy as Kenneth Moreno.  Patient does not have a living will at present time. If patient does have living will, I have requested they bring this to the clinic to be scanned in to their chart.  Patient Active Problem List   Diagnosis Date Noted  . Degenerative disc disease at L5-S1 level 11/11/2018  . Fatty liver 08/15/2017  . Benign paroxysmal positional nystagmus 07/02/2017  . Apnea, sleep 07/02/2017  . Obesity (BMI 35.Moreno-39.9 without comorbidity) 07/02/2017  . Hyperglycemia  04/03/2016  . Splenomegaly 01/25/2015  . Gastrointestinal complaints 12/21/2014  . Anxiety, generalized 12/21/2014  . Dyslipidemia 12/21/2014    Past Surgical History:  Procedure Laterality Date  . ganglion cyst removal  2006  . LITHOTRIPSY    . SINUS SURGERY WITH [place]  . TONSILLECTOMY  1990  Family History  Problem Relation Age of Onset  . Colon cancer Brother   . Colon cancer Maternal Uncle   . Diabetes Mellitus II Father   . Hyperlipidemia Father   . COPD Father   . Hypertension Daughter   . Prostate cancer Paternal Uncle   . Gallbladder disease Mother   . Hearing loss Mother        one of her ears. pt not sure which one  . Panic disorder Sister   . Hyperlipidemia Maternal Grandmother   . Gallbladder disease Maternal Grandmother   . Hyperlipidemia Maternal Grandfather   . Congestive Heart Failure Paternal Grandfather   . Hyperlipidemia Paternal Grandfather   . Diabetes Paternal Grandfather   . Kidney disease Neg Hx     Social History   Socioeconomic History  . Marital status: Married    Spouse name: Laverna Peace  . Number of children: 2  . Years of education: Not on file  . Highest education level: Not on file  Occupational History  . Not on file  Tobacco Use  . Smoking status: Never Smoker  . Smokeless tobacco: Never Used  Substance and Sexual Activity  . Alcohol use: No  . Drug use: No  . Sexual activity: Yes    Partners: Female  Other Topics Concern  . Not on file  Social History Narrative  . Not on file   Social Determinants of Health   Financial Resource Strain:   . Difficulty of Paying Living Expenses: Not on file  Food Insecurity:   . Worried About Charity fundraiser in the Last Year: Not on file  . Ran Out of Food in the Last Year: Not on file  Transportation Needs:   . Lack of Transportation (Medical): Not on file  . Lack of Transportation (Non-Medical): Not on file  Physical Activity:   . Days of Exercise per Week: Not on file   . Minutes of Exercise per Session: Not on file  Stress:   . Feeling of Stress : Not on file  Social Connections:   . Frequency of Communication with Friends and Family: Not on file  . Frequency of Social Gatherings with Friends and Family: Not on file  . Attends Religious Services: Not on file  . Active Member of Clubs or Organizations: Not on file  . Attends Archivist Meetings: Not on file  . Marital Status: Not on file  Intimate Partner Violence:   . Fear of Current or Ex-Partner: Not on file  . Emotionally Abused: Not on file  . Physically Abused: Not on file  . Sexually Abused: Not on file     Current Outpatient Medications:  .  hydrOXYzine (VISTARIL) 25 MG capsule, Take 1 capsule (25 mg total) by mouth every 6 (six) hours as needed. Do not drive for 6 hours after taking this medicine, Disp: 30 capsule, Rfl: 1 .  Icosapent Ethyl (VASCEPA) 1 g CAPS, Two capsules by mouth twice a day (to help with triglycerides), Disp: 120 capsule, Rfl: 11 .  PARoxetine (PAXIL) 40 MG tablet, TAKE 1 TABLET BY MOUTH EVERY DAY IN THE MORNING, Disp: 90 tablet, Rfl: 3  Allergies  Allergen Reactions  . Penicillins Diarrhea     ROS  Constitutional: Negative for fever or weight change.  Respiratory: Negative for cough and shortness of breath.   Cardiovascular: Negative for chest pain or palpitations.  Gastrointestinal: Negative for abdominal pain, no bowel changes.  Musculoskeletal: Negative for gait problem or joint swelling.  Skin: Negative for rash.  Neurological: Negative for dizziness or headache.  No other specific complaints in a complete review of systems (except as listed in HPI above).   Objective  Vitals:   05/15/19 0748  BP: 130/76  Pulse: 80  Resp: 18  Temp: 97.8 F (36.6 C)  TempSrc: Temporal  SpO2: 99%  Weight: 273 lb 12.8 oz (124.2 kg)  Height: 6' (1.829 m)    Body mass index is 37.13 kg/m.  Physical Exam   Constitutional: Patient appears  well-developed and well-nourished. No distress.  HENT: Head: Normocephalic and atraumatic. Ears: B TMs ok, no erythema or effusion; Nose: Nose normal. Mouth/Throat: Oropharynx is clear and moist. No oropharyngeal exudate.  Eyes: Conjunctivae and EOM are normal. Pupils are equal, round, and reactive to light. No scleral icterus.  Neck: Normal range of motion. Neck supple. No JVD present. No thyromegaly present.  Cardiovascular: Normal rate, regular rhythm and normal heart sounds.  No murmur heard. No BLE edema. Pulmonary/Chest: Effort normal and breath sounds normal. No respiratory distress. Abdominal: Soft. Bowel sounds are normal, no distension. There is no tenderness. no masses MALE GENITALIA: Normal descended testes bilaterally, no masses palpated, no hernias, no lesions, no discharge RECTAL: Prostate normal size and consistency, no rectal masses or hemorrhoids Musculoskeletal: Normal range of motion, no joint effusions. No gross deformities Neurological: he is alert and oriented to person, place, and time. No cranial nerve deficit. Coordination, balance, strength, speech and gait are normal.  Skin: Skin is warm and dry. No rash noted. No erythema.  Psychiatric: Patient has a normal mood and affect. behavior is normal. Judgment and thought content normal.   No results found for this or any previous visit (from the past 2160 hour(s)).   PHQ2/9: Depression screen [facility] 2/9 Moreno 11/11/2018 05/23/2018 08/15/2017 07/02/2017  Decreased Interest Moreno Moreno Moreno Moreno Moreno  Down, Depressed, Hopeless Moreno Moreno Moreno Moreno Moreno  PHQ - 2 Score Moreno Moreno Moreno Moreno Moreno  Altered sleeping Moreno Moreno Moreno - -  Tired, decreased energy Moreno Moreno Moreno - -  Change in appetite Moreno Moreno Moreno - -  Feeling bad or failure about yourself  Moreno Moreno Moreno - -  Trouble concentrating Moreno Moreno Moreno - -  Moving slowly or fidgety/restless Moreno Moreno Moreno - -  Suicidal thoughts Moreno Moreno Moreno - -  PHQ-9 Score Moreno Moreno Moreno - -  Difficult doing work/chores Not difficult at all Not difficult at all Not difficult at all - -    Fall  Risk: Fall Risk  Moreno 11/11/2018 05/23/2018 08/15/2017 07/02/2017  Falls in the past year? Moreno Moreno Moreno No No  Number falls in past yr: Moreno Moreno - - -  Injury with Fall? Moreno Moreno - - -  Follow up Falls evaluation completed - - - -    Assessment & Plan  1. Annual physical exam -Prostate cancer screening and PSA options (with potential risks and benefits of testing vs not testing) were discussed along with recent recs/guidelines. -USPSTF grade A and B recommendations reviewed with patient; age-appropriate recommendations, preventive care, screening tests, etc discussed and encouraged; healthy living encouraged; see AVS for patient education given to patient -Discussed importance of 150 minutes of physical activity weekly, eat two servings of fish weekly, eat one serving of tree nuts ( cashews, pistachios, pecans, almonds.Marland Kitchen) every other day, eat 6 servings of fruit/vegetables daily and drink plenty of water and avoid sweet beverages.    2. Dyslipidemia - icosapent Ethyl (VASCEPA) 1  g capsule; Two capsules by mouth twice a day (to help with triglycerides)  Dispense: 360 capsule; Refill: 3 - Lipid panel  3. Anxiety, generalized - PARoxetine (PAXIL) 40 MG tablet; TAKE 1 TABLET BY MOUTH EVERY DAY IN THE MORNING  Dispense: 90 tablet; Refill: 1 - hydrOXYzine (VISTARIL) 25 MG capsule; Take 1 capsule (25 mg total) by mouth every 6 (six) hours as needed. Do not drive for 6 hours after taking this medicine  Dispense: 90 capsule; Refill: 1  4. Lower urinary tract symptoms (LUTS) - Will start flomax if PSA WNL; if PSA elevated referral to urology to be placed - PSA  5. Prostate cancer screening - PSA  6. Encounter for hepatitis C screening test for low risk patient - Hepatitis C antibody  7. Diabetes mellitus screening - COMPLETE METABOLIC PANEL WITH GFR  8. Encounter for colorectal cancer screening - Was able to contact Dr. Edd Fabian office and patient is past due - we will refer today for follow up -  Ambulatory referral to Gastroenterology

## 2019-05-16 LAB — COMPLETE METABOLIC PANEL WITH GFR
AG Ratio: 2.1 (calc) (ref 1.0–2.5)
ALT: 30 U/L (ref 9–46)
AST: 15 U/L (ref 10–35)
Albumin: 4.4 g/dL (ref 3.6–5.1)
Alkaline phosphatase (APISO): 47 U/L (ref 35–144)
BUN: 19 mg/dL (ref 7–25)
CO2: 25 mmol/L (ref 20–32)
Calcium: 9.4 mg/dL (ref 8.6–10.3)
Chloride: 104 mmol/L (ref 98–110)
Creat: 1.16 mg/dL (ref 0.70–1.33)
GFR, Est African American: 85 mL/min/{1.73_m2} (ref >=60)
GFR, Est Non African American: 73 mL/min/{1.73_m2} (ref >=60)
Globulin: 2.1 g/dL (calc) (ref 1.9–3.7)
Glucose, Bld: 117 mg/dL — ABNORMAL HIGH (ref 65–99)
Potassium: 4.4 mmol/L (ref 3.5–5.3)
Sodium: 138 mmol/L (ref 135–146)
Total Bilirubin: 0.6 mg/dL (ref 0.2–1.2)
Total Protein: 6.5 g/dL (ref 6.1–8.1)

## 2019-05-16 LAB — HEPATITIS C ANTIBODY
Hepatitis C Ab: NONREACTIVE
SIGNAL TO CUT-OFF: 0.02 (ref <1.00)

## 2019-05-16 LAB — LIPID PANEL
Cholesterol: 203 mg/dL — ABNORMAL HIGH (ref <200)
HDL: 33 mg/dL — ABNORMAL LOW (ref >=40)
LDL Cholesterol (Calc): 123 mg/dL (calc) — ABNORMAL HIGH
Non-HDL Cholesterol (Calc): 170 mg/dL (calc) — ABNORMAL HIGH (ref <130)
Total CHOL/HDL Ratio: 6.2 (calc) — ABNORMAL HIGH (ref <5.0)
Triglycerides: 332 mg/dL — ABNORMAL HIGH (ref <150)

## 2019-05-16 LAB — PSA: PSA: 0.3 ng/mL (ref <=4.0)

## 2019-05-20 ENCOUNTER — Ambulatory Visit (INDEPENDENT_AMBULATORY_CARE_PROVIDER_SITE_OTHER): Payer: BLUE CROSS/BLUE SHIELD | Admitting: Emergency Medicine

## 2019-05-20 ENCOUNTER — Other Ambulatory Visit: Payer: Self-pay

## 2019-05-20 DIAGNOSIS — R7309 Other abnormal glucose: Secondary | ICD-10-CM | POA: Diagnosis not present

## 2019-05-20 LAB — POCT GLYCOSYLATED HEMOGLOBIN (HGB A1C): Hemoglobin A1C: 5.2 % (ref 4.0–5.6)

## 2019-05-27 ENCOUNTER — Ambulatory Visit: Payer: Self-pay

## 2019-05-27 DIAGNOSIS — N401 Enlarged prostate with lower urinary tract symptoms: Secondary | ICD-10-CM

## 2019-05-27 DIAGNOSIS — R350 Frequency of micturition: Secondary | ICD-10-CM

## 2019-05-27 NOTE — Telephone Encounter (Signed)
Wife asking what Vistaril was for. Reviewed with her. Also asking if Ms. Uvaldo Rising was calling in "something for his prostate and urinary symptoms."Please advise.  Answer Assessment - Initial Assessment Questions 1.   NAME of MEDICATION: "What medicine are you calling about?"     Vistaril  2.   QUESTION: "What is your question?"     What is it for? 3.   PRESCRIBING HCP: "Who prescribed it?" Reason: if prescribed by specialist, call should be referred to that group.     Raelyn Ensign 4. SYMPTOMS: "Do you have any symptoms?"     Some anxiety 5. SEVERITY: If symptoms are present, ask "Are they mild, moderate or severe?"     Mild 6.  PREGNANCY:  "Is there any chance that you are pregnant?" "When was your last menstrual period?"     n/a  Protocols used: MEDICATION QUESTION CALL-A-AH

## 2019-05-27 NOTE — Telephone Encounter (Signed)
Were you calling in anything for him

## 2019-05-27 NOTE — Telephone Encounter (Signed)
I sent 90 capsules of hydroxyzine in on 05/15/19.

## 2019-05-28 MED ORDER — TAMSULOSIN HCL 0.4 MG PO CAPS: 0.4000 mg | ORAL_CAPSULE | Freq: Every day | ORAL | 1 refills | Status: DC

## 2019-05-28 NOTE — Telephone Encounter (Signed)
Left message for patient

## 2019-05-28 NOTE — Telephone Encounter (Signed)
Were you calling in something for prostate.

## 2019-05-28 NOTE — Telephone Encounter (Signed)
Thanks for clarifying - yes - we discussed starting flomax if PSA normal to treat for urinary symptoms and BPH.  I am sending this is now.

## 2019-05-28 NOTE — Addendum Note (Signed)
Addended by: Hubbard Hartshorn on: 05/28/2019 10:37 AM   Modules accepted: Orders

## 2019-09-11 ENCOUNTER — Ambulatory Visit: Payer: BLUE CROSS/BLUE SHIELD | Admitting: Family Medicine

## 2019-11-12 NOTE — Progress Notes (Signed)
Patient ID: Kenneth Moreno, male    DOB: 10-Sep-1970, 49 y.o.   MRN: [id number]  PCP: Towanda Malkin, MD  Chief Complaint  Patient presents with  . Follow-up    6 month f/u, patient said he is here to follow up, he is doing well    Subjective:   Kenneth Moreno is a 49 y.o. male, presents to clinic with CC of the following:  Chief Complaint  Patient presents with  . Follow-up    6 month f/u, patient said he is here to follow up, he is doing well    HPI:  Patient is a 49 year old male His last visit at [facility] was in January 2021 for a physical Follows up today. In general, notes feeling fine Patient is a challenging historian.  Anxiety Medication regimen-paxil 40mg  daily,  has been on this for over 15 years.  Raquel Sarna added hydroxyzine 10 mg as needed after the last visit, not used much at all States even with all that has been going on with losing work and the pandemic has been well-controlled.  Has a strong family history of anxiety.    Hyperlipidemia Medication regimen- vascepa 2 grams BID.  Was on statin prior and stopped due to lab test, details not clear per patient, question of elevated liver function test. Takes medications as prescribed with no missed doses a month.  Denies myalgias No recent chest pains, palpitations, shortness of breath, notes occasionally his ankles can swell at the end of the day after on his feet a lot, no increased headaches or vision changes Lab Results  Component Value Date   CHOL 203 (H) 05/15/2019   HDL 33 (L) 05/15/2019   LDLCALC 123 (H) 05/15/2019   TRIG 332 (H) 05/15/2019   CHOLHDL 6.2 (H) 05/15/2019    History of Splenomegaly  Followed by oncology- 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 who discussed concerning signs and symptoms to report and follow-up PRN as last CT scan 07/11/2017 showed normal spleen.   Denies any concerning recent abdominal pains.  Obesity/fatty liver disease Wt Readings from Last 3 Encounters:  11/13/19 275 lb 11.2  oz (125.1 kg)  05/15/19 273 lb 12.8 oz (124.2 kg)  11/11/18 268 lb 14.4 oz (122 kg)   Weight pretty stable Diet  - trying to eat healthy, but not very strict, trying to do a better job of portion control recently Exercise - 3X/week for one hour does some mild exercise  Sleep apnea - states he had a sleep study over 5 years ago states they said he had a mild form but did not require CPAP- had ENT surgery 10 years ago and has not had issues since.   Colorectal cancer screening -patient was noted to be overdue at the physical in January, and a referral placed for a repeat colonoscopy.  Has not been done to date Positive family history - D's brother had colon CA, others on Dad's side Occas BRBPR with BM's in recent past.  No black or dark stools.  LTUS/prostate cancer screening -  Lab Results  Component Value Date   PSA 0.3 05/15/2019   PSA 0.3 05/08/2017  Gets up 4+ times a night to urinate, denies hesitancy, goes a lot slower, no urgency, no hematuria noted, no dysuria Flomax was initiated after his physical in January, not sure if taking or not, he thinks not.  Patient Active Problem List   Diagnosis Date Noted  . Degenerative disc disease at L5-S1 level 11/11/2018  .  Fatty liver 08/15/2017  . Benign paroxysmal positional nystagmus 07/02/2017  . Apnea, sleep 07/02/2017  . Obesity (BMI 35.0-39.9 without comorbidity) 07/02/2017  . Hyperglycemia 04/03/2016  . Splenomegaly 01/25/2015  . Gastrointestinal complaints 12/21/2014  . Anxiety, generalized 12/21/2014  . Dyslipidemia 12/21/2014      Current Outpatient Medications:  .  hydrOXYzine (VISTARIL) 25 MG capsule, Take 1 capsule (25 mg total) by mouth every 6 (six) hours as needed. Do not drive for 6 hours after taking this medicine, Disp: 90 capsule, Rfl: 1 .  icosapent Ethyl (VASCEPA) 1 g capsule, Two capsules by mouth twice a day (to help with triglycerides), Disp: 360 capsule, Rfl: 3 .  PARoxetine (PAXIL) 40 MG tablet, TAKE 1  TABLET BY MOUTH EVERY DAY IN THE MORNING, Disp: 90 tablet, Rfl: 1 .  tamsulosin (FLOMAX) 0.4 MG CAPS capsule, Take 1 capsule (0.4 mg total) by mouth daily., Disp: 90 capsule, Rfl: 1   Allergies  Allergen Reactions  . Penicillins Diarrhea     Past Surgical History:  Procedure Laterality Date  . ganglion cyst removal  2006  . LITHOTRIPSY    . SINUS SURGERY WITH [place]  . TONSILLECTOMY  1990     Family History  Problem Relation Age of Onset  . Colon cancer Brother   . Colon cancer Maternal Uncle   . Diabetes Mellitus II Father   . Hyperlipidemia Father   . COPD Father   . Hypertension Daughter   . Prostate cancer Paternal Uncle   . Gallbladder disease Mother   . Hearing loss Mother        one of her ears. pt not sure which one  . Panic disorder Sister   . Hyperlipidemia Maternal Grandmother   . Gallbladder disease Maternal Grandmother   . Hyperlipidemia Maternal Grandfather   . Congestive Heart Failure Paternal Grandfather   . Hyperlipidemia Paternal Grandfather   . Diabetes Paternal Grandfather   . Kidney disease Neg Hx      Social History   Tobacco Use  . Smoking status: Never Smoker  . Smokeless tobacco: Never Used  Substance Use Topics  . Alcohol use: No    With staff assistance, above reviewed with the patient today.  ROS: As per HPI, otherwise no specific complaints on a limited and focused system review   No results found for this or any previous visit (from the past 72 hour(s)).   PHQ2/9: Depression screen [facility] 2/9 11/13/2019 05/15/2019 11/11/2018 05/23/2018 08/15/2017  Decreased Interest 0 0 0 0 0  Down, Depressed, Hopeless 0 0 0 0 0  PHQ - 2 Score 0 0 0 0 0  Altered sleeping 0 0 0 0 -  Tired, decreased energy 0 0 0 0 -  Change in appetite 0 0 0 0 -  Feeling bad or failure about yourself  0 0 0 0 -  Trouble concentrating 0 0 0 0 -  Moving slowly or fidgety/restless 0 0 0 0 -  Suicidal thoughts 0 0 0 0 -  PHQ-9 Score 0 0 0 0 -  Difficult  doing work/chores Not difficult at all Not difficult at all Not difficult at all Not difficult at all -   PHQ-2/9 Result is neg  GAD 7 : Generalized Anxiety Score 11/13/2019 05/23/2018  Nervous, Anxious, on Edge 0 0  Control/stop worrying 0 0  Worry too much - different things 0 0  Trouble relaxing 0 0  Restless 1 0  Easily annoyed or irritable 0 0  Afraid - awful might happen 0 0  Total GAD 7 Score 1 0  Anxiety Difficulty Not difficult at all Not difficult at all   GAD reviewed   Fall Risk: Fall Risk  11/13/2019 05/15/2019 11/11/2018 05/23/2018 08/15/2017  Falls in the past year? 0 0 0 0 No  Number falls in past yr: 0 0 0 - -  Injury with Fall? 0 0 0 - -  Follow up - Falls evaluation completed - - -      Objective:   Vitals:   11/13/19 0917  BP: 122/80  Pulse: 80  Resp: 16  Temp: 98.1 F (36.7 C)  TempSrc: Temporal  SpO2: 97%  Weight: 275 lb 11.2 oz (125.1 kg)  Height: 6' (1.829 m)    Body mass index is 37.39 kg/m.  Physical Exam   NAD, masked, pleasant HEENT - [place]/AT, sclera anicteric, PERRL, EOMI, conj - non-inj'ed, pharynx clear Neck - supple, no adenopathy, no TM, carotids 2+ and = without bruits bilat Car - RRR without m/g/r Pulm- RR and effort normal at rest, CTA without wheeze or rales Abd - soft, NT, obese, ND, BS+,  no masses, no obvious HSM on exam Back - no CVA tenderness Ext - no LE edema, no active joints Rectal/prostate-exam done on his physical in January, with the prostate noted to be normal in size and consistency.  Did not repeat today as he noted no increasing symptoms of concern since that physical. Neuro/psychiatric - affect was not flat, appropriate with conversation  Alert and oriented, slow deliberate talker, struggled to finish thoughts off and stopping for 5 seconds plus before trying to finish  Grossly non-focal - good strength on testing extremities, sensation intact to LT in distal extremities  Speech normal   Results for orders placed  or performed in visit on 05/20/19  POCT HgB A1C  Result Value Ref Range   Hemoglobin A1C 5.2 4.0 - 5.6 %   HbA1c POC (<> result, manual entry)     HbA1c, POC (prediabetic range)     HbA1c, POC (controlled diabetic range)     Last labs reviewed from January    Assessment & Plan:   1. Screening for colorectal cancer/recent BRBPR -  He noted he has a history of hemorrhoids in the past, and this is likely the source of the bright red blood per rectum.  Due for an upcoming colonoscopy as below Noted on last physical in January that he was overdue, and was ordered, although patient never had it completed. Agreed to have done, and a referral again done today. - Ambulatory referral to Gastroenterology  2. Anxiety, generalized Remains well controlled on his current medication and to continue. Has not needed the hydroxyzine as needed to any great extent recently  3. Mixed hyperlipidemia Noted the abnormal lipid panel in January, and he is only on a Vascepa product Discussed a statin product, as I do think it would be a good addition, and he noted he was on this at one point, and stopped he thinks due to a lab issue. We will try to get more details about this before adding a statin entity presently. Dietary modifications and staying physically active important to helping.  Information provided in the AVS as well  4. Lower urinary tract symptoms (LUTS) Patient was not clear that he started the tamsulosin product recommended after his last physical, he thinks he did not.  It is on his med list. Recommended when he gets home to check if  he is taking that medicine, and call us back and if he is not, do think it would be good to start.  We will prescribe that for him. Did recommend bringing his medicines with him on future visits.  5. Obstructive sleep apnea syndrome Has continued to do well after his surgery as noted previously  6. Class 2 severe obesity due to excess calories with serious  comorbidity and body mass index (BMI) of 37.0 to 37.9 in adult [facility]) Discussed the importance of healthy weight maintenance, and continue with dietary modifications and portion control as he is trying.  7. Fatty liver Noted in his history. Question if this is related to the statin issue above  8. Splenomegaly Denies any recent concerns clinically, with last CT okay noted. We will continue to monitor.   Plan to have a colonoscopy again and referral to GI placed today, will phone [place] back and if not on tamsulosin will initiate to help with his LUTS symptoms, especially if frequency overnight, and also try to get more information about his intolerance to statins, as I do think it would be a good medicine for him presently.  We will hold off on any further labs today and plan to do them on follow-up by the end of the year, follow-up sooner as needed.     Towanda Malkin, MD 11/13/19 9:44 AM

## 2019-11-13 ENCOUNTER — Telehealth: Payer: Self-pay

## 2019-11-13 ENCOUNTER — Encounter: Payer: Self-pay | Admitting: Internal Medicine

## 2019-11-13 ENCOUNTER — Ambulatory Visit (INDEPENDENT_AMBULATORY_CARE_PROVIDER_SITE_OTHER): Payer: 59 | Admitting: Internal Medicine

## 2019-11-13 ENCOUNTER — Other Ambulatory Visit: Payer: Self-pay

## 2019-11-13 ENCOUNTER — Ambulatory Visit: Payer: BLUE CROSS/BLUE SHIELD | Admitting: Family Medicine

## 2019-11-13 VITALS — BP 122/80 | HR 80 | Temp 98.1°F | Resp 16 | Ht 72.0 in | Wt 275.7 lb

## 2019-11-13 DIAGNOSIS — G4733 Obstructive sleep apnea (adult) (pediatric): Secondary | ICD-10-CM

## 2019-11-13 DIAGNOSIS — Z1212 Encounter for screening for malignant neoplasm of rectum: Secondary | ICD-10-CM

## 2019-11-13 DIAGNOSIS — E782 Mixed hyperlipidemia: Secondary | ICD-10-CM

## 2019-11-13 DIAGNOSIS — K760 Fatty (change of) liver, not elsewhere classified: Secondary | ICD-10-CM

## 2019-11-13 DIAGNOSIS — R350 Frequency of micturition: Secondary | ICD-10-CM

## 2019-11-13 DIAGNOSIS — R399 Unspecified symptoms and signs involving the genitourinary system: Secondary | ICD-10-CM

## 2019-11-13 DIAGNOSIS — F411 Generalized anxiety disorder: Secondary | ICD-10-CM

## 2019-11-13 DIAGNOSIS — Z1211 Encounter for screening for malignant neoplasm of colon: Secondary | ICD-10-CM | POA: Diagnosis not present

## 2019-11-13 DIAGNOSIS — R161 Splenomegaly, not elsewhere classified: Secondary | ICD-10-CM

## 2019-11-13 DIAGNOSIS — Z6837 Body mass index (BMI) 37.0-37.9, adult: Secondary | ICD-10-CM

## 2019-11-13 MED ORDER — TAMSULOSIN HCL 0.4 MG PO CAPS: 0.4000 mg | ORAL_CAPSULE | Freq: Every day | ORAL | 1 refills | Status: DC

## 2019-11-13 NOTE — Telephone Encounter (Signed)
Patient called to report he is currently ONLY taking Vacepa 1g and Paxil 40mg . A prescription for tamsulosin 0.4 mg was sent per Dr. Roxan Hockey request. Patient says Dr. Sanda Klein given him Lipitor and after blood work she had discussed switching it but that was never done.

## 2019-11-13 NOTE — Patient Instructions (Signed)
Referral placed today for colonoscopy  Check if taking tamsulosin (flomax) when get home and call back to [facility] and let us know   High Cholesterol  High cholesterol is a condition in which the blood has high levels of a white, waxy, fat-like substance (cholesterol). The human body needs small amounts of cholesterol. The liver makes all the cholesterol that the body needs. Extra (excess) cholesterol comes from the food that we eat. Cholesterol is carried from the liver by the blood through the blood vessels. If you have high cholesterol, deposits (plaques) may build up on the walls of your blood vessels (arteries). Plaques make the arteries narrower and stiffer. Cholesterol plaques increase your risk for heart attack and stroke. Work with your health care provider to keep your cholesterol levels in a healthy range. What increases the risk? This condition is more likely to develop in people who:  Eat foods that are high in animal fat (saturated fat) or cholesterol.  Are overweight.  Are not getting enough exercise.  Have a family history of high cholesterol. What are the signs or symptoms? There are no symptoms of this condition. How is this diagnosed? This condition may be diagnosed from the results of a blood test.  If you are older than age 48, your health care provider may check your cholesterol every 4-6 years.  You may be checked more often if you already have high cholesterol or other risk factors for heart disease. The blood test for cholesterol measures:  "Bad" cholesterol (LDL cholesterol). This is the main type of cholesterol that causes heart disease. The desired level for LDL is less than 100.  "Good" cholesterol (HDL cholesterol). This type helps to protect against heart disease by cleaning the arteries and carrying the LDL away. The desired level for HDL is 60 or higher.  Triglycerides. These are fats that the body can store or burn for energy. The desired number for  triglycerides is lower than 150.  Total cholesterol. This is a measure of the total amount of cholesterol in your blood, including LDL cholesterol, HDL cholesterol, and triglycerides. A healthy number is less than 200. How is this treated? This condition is treated with diet changes, lifestyle changes, and medicines. Diet changes  This may include eating more whole grains, fruits, vegetables, nuts, and fish.  This may also include cutting back on red meat and foods that have a lot of added sugar. Lifestyle changes  Changes may include getting at least 40 minutes of aerobic exercise 3 times a week. Aerobic exercises include walking, biking, and swimming. Aerobic exercise along with a healthy diet can help you maintain a healthy weight.  Changes may also include quitting smoking. Medicines  Medicines are usually given if diet and lifestyle changes have failed to reduce your cholesterol to healthy levels.  Your health care provider may prescribe a statin medicine. Statin medicines have been shown to reduce cholesterol, which can reduce the risk of heart disease. Follow these instructions at home: Eating and drinking If told by your health care provider:  Eat chicken (without skin), fish, veal, shellfish, ground [place] breast, and round or loin cuts of red meat.  Do not eat fried foods or fatty meats, such as hot dogs and salami.  Eat plenty of fruits, such as apples.  Eat plenty of vegetables, such as broccoli, potatoes, and carrots.  Eat beans, peas, and lentils.  Eat grains such as barley, rice, couscous, and bulgur wheat.  Eat pasta without cream sauces.  Use  skim or nonfat milk, and eat low-fat or nonfat yogurt and cheeses.  Do not eat or drink whole milk, cream, ice cream, egg yolks, or hard cheeses.  Do not eat stick margarine or tub margarines that contain trans fats (also called partially hydrogenated oils).  Do not eat saturated tropical oils, such as coconut oil and  palm oil.  Do not eat cakes, cookies, crackers, or other baked goods that contain trans fats.  General instructions  Exercise as directed by your health care provider. Increase your activity level with activities such as gardening, walking, and taking the stairs.  Take over-the-counter and prescription medicines only as told by your health care provider.  Do not use any products that contain nicotine or tobacco, such as cigarettes and e-cigarettes. If you need help quitting, ask your health care provider.  Keep all follow-up visits as told by your health care provider. This is important. Contact a health care provider if:  You are struggling to maintain a healthy diet or weight.  You need help to start on an exercise program.  You need help to stop smoking. Get help right away if:  You have chest pain.  You have trouble breathing. This information is not intended to replace advice given to you by your health care provider. Make sure you discuss any questions you have with your health care provider. Document Revised: 04/20/2017 Document Reviewed: 10/16/2015 Elsevier Patient Education  [place].

## 2019-11-21 ENCOUNTER — Other Ambulatory Visit: Payer: Self-pay | Admitting: Internal Medicine

## 2019-11-21 DIAGNOSIS — E782 Mixed hyperlipidemia: Secondary | ICD-10-CM

## 2019-11-21 MED ORDER — OMEGA-3-ACID ETHYL ESTERS 1 G PO CAPS: 2.0000 g | ORAL_CAPSULE | Freq: Two times a day (BID) | ORAL | 1 refills | Status: DC

## 2019-11-21 NOTE — Addendum Note (Signed)
Addended by: Lennie Muckle on: 11/21/2019 04:00 PM   Modules accepted: Orders

## 2019-11-21 NOTE — Telephone Encounter (Signed)
Patient's wife Sonia Baller) called to ask the nurse or doctor to send an alternative med. To his icosapent Ethyl (VASCEPA) 1 g capsule, because he is having some insurance issues and cannot get the script filled.  She stated that he is all out and needs the medication.  Please call to discuss at [phone]

## 2019-11-21 NOTE — Telephone Encounter (Signed)
Patient informed medication has been changed to Lovaza.

## 2019-11-21 NOTE — Telephone Encounter (Signed)
Please let patient know that I have prescribed Lovaza as an alternative prescription to the Vascepa which they were unable to obtain due to insurance issues. Thanks, [facility]

## 2019-11-21 NOTE — Progress Notes (Signed)
Patient prescribed Lovaza-2 g twice daily as informed unable to get the Vascepa product due to insurance issues.

## 2019-11-25 ENCOUNTER — Encounter: Payer: Self-pay | Admitting: *Deleted

## 2019-12-23 ENCOUNTER — Other Ambulatory Visit: Payer: Self-pay

## 2019-12-23 DIAGNOSIS — F411 Generalized anxiety disorder: Secondary | ICD-10-CM

## 2019-12-23 MED ORDER — PAROXETINE HCL 40 MG PO TABS: ORAL_TABLET | ORAL | 1 refills | Status: DC

## 2020-04-13 NOTE — Progress Notes (Signed)
Patient ID: Kenneth Moreno, male    DOB: 1970/09/06, 49 y.o.   MRN: [id number]  PCP: Towanda Malkin, MD  Chief Complaint  Patient presents with  . Follow-up    Subjective:   Kenneth Moreno is a 49 y.o. male, presents to clinic with CC of the following:  Chief Complaint  Patient presents with  . Follow-up    HPI:  Patient is a 48 year old male Last visit with me was 11/13/2019 Follows up today.  In general, notes feeling fine, no complaints Patient is a challenging historian. Did bring a sheet with him today with the 3 medications he is taking which was helpful.  Anxiety Medication regimen-paxil 40mg  daily, has been on this for over 15 years.  Has hydroxyzine 10 mg as needed, not used in the recent past States has been well controlled Has a strong family history of anxiety.   Hyperlipidemia Medication regimen-omega 3's, not taking vascepa. Was on statin prior and stopped with details not clear per patient, questioned if triggered anxiety, question if it was an abnormal lab, not opposed to going back on a statin I feel may be the best option and await recheck of labs today Denies myalgias No recent chest pains, palpitations, shortness of breath, no LE swelling, no increased headaches or vision changes Lab Results  Component Value Date   CHOL 203 (H) 05/15/2019   HDL 33 (L) 05/15/2019   LDLCALC 123 (H) 05/15/2019   TRIG 332 (H) 05/15/2019   CHOLHDL 6.2 (H) 05/15/2019     History of Splenomegaly  Followed by oncology- 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 who discussed concerning signs and symptoms to report and follow-up PRN as last CT scan3/13/2019showed normal spleen.  Denies any concerning recent abdominal pains.  Obesity/fatty liver disease Wt Readings from Last 3 Encounters:  04/14/20 278 lb 6.4 oz (126.3 kg)  11/13/19 275 lb 11.2 oz (125.1 kg)  05/15/19 273 lb 12.8 oz (124.2 kg)   Weight pretty stable to creeping up a little Diet  - trying to eat  healthy, but not very strict, "not as good as I should", trying to do a better job of portion control recently Exercise - 3X/week for one hour does some mild exercise  Sleep apnea - states he had a sleep study over 5 years ago states they said he had a mild form but did not require CPAP- had ENT surgery 10 years ago and has not had issues since.  Colorectal cancer screening -patient was noted to be overdue at the physical in January, and a referral placed for a repeat colonoscopy.  Has not been done to date Positive family history - D's brother had colon CA, others on Dad's side Occas BRBPR with BM's in recent past.  No black or dark stools. Will reorder again today.  LTUS/prostate cancer screening -       Lab Results  Component Value Date   PSA 0.3 05/15/2019   PSA 0.3 05/08/2017  Gets up 2+ times a night to urinate, improved after starting the tamsulosin product, denies hesitancy, better flow, no urgency, no hematuria noted, no dysuria Tamsulosin was initiated after his physical in January, is taking and thinks been helpful     Patient Active Problem List   Diagnosis Date Noted  . Class 2 severe obesity due to excess calories with serious comorbidity and body mass index (BMI) of 37.0 to 37.9 in adult ([place]) 11/13/2019  . Mixed hyperlipidemia 11/13/2019  . Lower urinary tract  symptoms (LUTS) 11/13/2019  . Degenerative disc disease at L5-S1 level 11/11/2018  . Fatty liver 08/15/2017  . Benign paroxysmal positional nystagmus 07/02/2017  . Apnea, sleep 07/02/2017  . Obesity (BMI 35.0-39.9 without comorbidity) 07/02/2017  . Hyperglycemia 04/03/2016  . Splenomegaly 01/25/2015  . Gastrointestinal complaints 12/21/2014  . Anxiety, generalized 12/21/2014  . Dyslipidemia 12/21/2014      Current Outpatient Medications:  .  hydrOXYzine (VISTARIL) 25 MG capsule, Take 1 capsule (25 mg total) by mouth every 6 (six) hours as needed. Do not drive for 6 hours after taking this  medicine, Disp: 90 capsule, Rfl: 1 .  icosapent Ethyl (VASCEPA) 1 g capsule, Two capsules by mouth twice a day (to help with triglycerides), Disp: 360 capsule, Rfl: 3 .  omega-3 acid ethyl esters (LOVAZA) 1 g capsule, Take 2 capsules (2 g total) by mouth 2 (two) times daily., Disp: 360 capsule, Rfl: 1 .  PARoxetine (PAXIL) 40 MG tablet, TAKE 1 TABLET BY MOUTH EVERY DAY IN THE MORNING, Disp: 90 tablet, Rfl: 1 .  tamsulosin (FLOMAX) 0.4 MG CAPS capsule, Take 1 capsule (0.4 mg total) by mouth daily., Disp: 90 capsule, Rfl: 1   Allergies  Allergen Reactions  . Penicillins Diarrhea     Past Surgical History:  Procedure Laterality Date  . ganglion cyst removal  2006  . LITHOTRIPSY    . SINUS SURGERY WITH [place]  . TONSILLECTOMY  1990     Family History  Problem Relation Age of Onset  . Colon cancer Brother   . Colon cancer Maternal Uncle   . Diabetes Mellitus II Father   . Hyperlipidemia Father   . COPD Father   . Hypertension Daughter   . Prostate cancer Paternal Uncle   . Gallbladder disease Mother   . Hearing loss Mother        one of her ears. pt not sure which one  . Panic disorder Sister   . Hyperlipidemia Maternal Grandmother   . Gallbladder disease Maternal Grandmother   . Hyperlipidemia Maternal Grandfather   . Congestive Heart Failure Paternal Grandfather   . Hyperlipidemia Paternal Grandfather   . Diabetes Paternal Grandfather   . Kidney disease Neg Hx      Social History   Tobacco Use  . Smoking status: Never Smoker  . Smokeless tobacco: Never Used  Substance Use Topics  . Alcohol use: No    With staff assistance, above reviewed with the patient today.  ROS: As per HPI, otherwise no specific complaints on a limited and focused system review   No results found for this or any previous visit (from the past 72 hour(s)).   PHQ2/9: Depression screen [facility] 2/9 04/14/2020 11/13/2019 05/15/2019 11/11/2018 05/23/2018  Decreased Interest 0 0 0 0 0  Down,  Depressed, Hopeless 0 0 0 0 0  PHQ - 2 Score 0 0 0 0 0  Altered sleeping - 0 0 0 0  Tired, decreased energy - 0 0 0 0  Change in appetite - 0 0 0 0  Feeling bad or failure about yourself  - 0 0 0 0  Trouble concentrating - 0 0 0 0  Moving slowly or fidgety/restless - 0 0 0 0  Suicidal thoughts - 0 0 0 0  PHQ-9 Score - 0 0 0 0  Difficult doing work/chores - Not difficult at all Not difficult at all Not difficult at all Not difficult at all   PHQ-2/9 Result is neg  Fall Risk: Fall Risk  04/14/2020 11/13/2019 05/15/2019 11/11/2018 05/23/2018  Falls in the past year? 0 0 0 0 0  Number falls in past yr: 0 0 0 0 -  Injury with Fall? 0 0 0 0 -  Follow up - - Falls evaluation completed - -      Objective:   Vitals:   04/14/20 0820  BP: 120/88  Pulse: 77  Resp: 16  Temp: 97.6 F (36.4 C)  TempSrc: Oral  SpO2: 96%  Weight: 278 lb 6.4 oz (126.3 kg)  Height: 6' (1.829 m)    Body mass index is 37.76 kg/m.  Physical Exam    NAD, masked, pleasant HEENT - [place]/AT, sclera anicteric, PERRL, EOMI, conj - non-inj'ed, pharynx clear Neck - supple, no adenopathy, no TM, carotids 2+ and = without bruits bilat Car - RRR without m/g/r Pulm- RR and effort normal at rest, CTA without wheeze or rales Abd - soft, NT diffusely, obese, ND,  Back - no CVA tenderness Ext - no LE edema, Neuro/psychiatric - affect was not flat, appropriate with conversation      Alert, slow deliberate talker,       Grossly non-focal - good strength on testing extremities, sensation intact to LT in distal extremities      Speech normal    Results for orders placed or performed in visit on 05/20/19  POCT HgB A1C  Result Value Ref Range   Hemoglobin A1C 5.2 4.0 - 5.6 %   HbA1c POC (<> result, manual entry)     HbA1c, POC (prediabetic range)     HbA1c, POC (controlled diabetic range)     Not have Covid vaccine nor booster, tried to encourage getting today Had flu vaccine Past labs reviewed Assessment & Plan:     1. Hyperglycemia Noted on last lab review We will check an A1c with the labs today. He has no history of diabetes and has never been told he was diabetic in the past  2. Anxiety, generalized Remains controlled on the Paxil Continue to monitor  3. Mixed hyperlipidemia Last lipids reviewed, with the abnormalities noted, and currently only taking omega-3's to help.  Was prescribed Vascepa previously although he stopped taking that. Statin was tried previously, unclear why it was discontinued, and he is not opposed to restarting if recommended, and likely will and await recheck of labs today. Also recommended better dietary modifications to help as best he can in the future  4. Splenomegaly Denies any recent concerns clinically, with last CT okay noted. We will continue to monitor.  5. Class 2 severe obesity due to excess calories with serious comorbidity and body mass index (BMI) of 37.0 to 37.9 in adult [facility]) Discussed the importance of healthy weight maintenance, and continue with dietary modifications and portion control as he is trying.  6. Lower urinary tract symptoms (LUTS) Notes his symptoms improved after taking the tamsulosin product recommended and will continue presently. 7. Benign prostatic hyperplasia with urinary frequency As noted above  8. Obstructive sleep apnea syndrome Has continued to do well after his surgery as noted previously  9. Screening for colorectal cancer 10. Family history of colon cancer Noted on last physical in January that he was overdue, and was ordered, although patient never had it completed. Agreed to have done, and a referral again done today. - Ambulatory referral to Gastroenterology  11. Need for immunization against influenza  - Flu Vaccine QUAD 36+ mos IM  Await lab results from today, with a follow-up planned again in 6  months, sooner as needed He is aware that the follow-up will be with a different provider as I will be leaving  this practice prior to that planned follow-up     Towanda Malkin, MD 04/14/20 8:33 AM

## 2020-04-14 ENCOUNTER — Encounter: Payer: Self-pay | Admitting: Internal Medicine

## 2020-04-14 ENCOUNTER — Ambulatory Visit: Payer: 59 | Admitting: Internal Medicine

## 2020-04-14 ENCOUNTER — Other Ambulatory Visit: Payer: Self-pay

## 2020-04-14 VITALS — BP 120/88 | HR 77 | Temp 97.6°F | Resp 16 | Ht 72.0 in | Wt 278.4 lb

## 2020-04-14 DIAGNOSIS — R399 Unspecified symptoms and signs involving the genitourinary system: Secondary | ICD-10-CM

## 2020-04-14 DIAGNOSIS — F411 Generalized anxiety disorder: Secondary | ICD-10-CM

## 2020-04-14 DIAGNOSIS — R739 Hyperglycemia, unspecified: Secondary | ICD-10-CM | POA: Diagnosis not present

## 2020-04-14 DIAGNOSIS — Z23 Encounter for immunization: Secondary | ICD-10-CM

## 2020-04-14 DIAGNOSIS — R350 Frequency of micturition: Secondary | ICD-10-CM

## 2020-04-14 DIAGNOSIS — R161 Splenomegaly, not elsewhere classified: Secondary | ICD-10-CM

## 2020-04-14 DIAGNOSIS — Z1212 Encounter for screening for malignant neoplasm of rectum: Secondary | ICD-10-CM

## 2020-04-14 DIAGNOSIS — E782 Mixed hyperlipidemia: Secondary | ICD-10-CM

## 2020-04-14 DIAGNOSIS — Z800 Family history of malignant neoplasm of digestive organs: Secondary | ICD-10-CM

## 2020-04-14 DIAGNOSIS — Z1211 Encounter for screening for malignant neoplasm of colon: Secondary | ICD-10-CM

## 2020-04-14 DIAGNOSIS — G4733 Obstructive sleep apnea (adult) (pediatric): Secondary | ICD-10-CM

## 2020-04-14 DIAGNOSIS — N401 Enlarged prostate with lower urinary tract symptoms: Secondary | ICD-10-CM | POA: Insufficient documentation

## 2020-04-14 DIAGNOSIS — Z6837 Body mass index (BMI) 37.0-37.9, adult: Secondary | ICD-10-CM

## 2020-04-15 LAB — COMPLETE METABOLIC PANEL WITH GFR
AG Ratio: 2 (calc) (ref 1.0–2.5)
ALT: 32 U/L (ref 9–46)
AST: 17 U/L (ref 10–35)
Albumin: 4.3 g/dL (ref 3.6–5.1)
Alkaline phosphatase (APISO): 49 U/L (ref 35–144)
BUN: 20 mg/dL (ref 7–25)
CO2: 27 mmol/L (ref 20–32)
Calcium: 9.3 mg/dL (ref 8.6–10.3)
Chloride: 103 mmol/L (ref 98–110)
Creat: 1.07 mg/dL (ref 0.70–1.33)
GFR, Est African American: 93 mL/min/{1.73_m2} (ref >=60)
GFR, Est Non African American: 80 mL/min/{1.73_m2} (ref >=60)
Globulin: 2.2 g/dL (calc) (ref 1.9–3.7)
Glucose, Bld: 102 mg/dL — ABNORMAL HIGH (ref 65–99)
Potassium: 4.7 mmol/L (ref 3.5–5.3)
Sodium: 139 mmol/L (ref 135–146)
Total Bilirubin: 0.5 mg/dL (ref 0.2–1.2)
Total Protein: 6.5 g/dL (ref 6.1–8.1)

## 2020-04-15 LAB — CBC WITH DIFFERENTIAL/PLATELET
Absolute Monocytes: 669 cells/uL (ref 200–950)
Basophils Absolute: 28 cells/uL (ref 0–200)
Basophils Relative: 0.4 %
Eosinophils Absolute: 179 cells/uL (ref 15–500)
Eosinophils Relative: 2.6 %
HCT: 45.2 % (ref 38.5–50.0)
Hemoglobin: 15.5 g/dL (ref 13.2–17.1)
Lymphs Abs: 1884 cells/uL (ref 850–3900)
MCH: 28.5 pg (ref 27.0–33.0)
MCHC: 34.3 g/dL (ref 32.0–36.0)
MCV: 83.2 fL (ref 80.0–100.0)
MPV: 10.8 fL (ref 7.5–12.5)
Monocytes Relative: 9.7 %
Neutro Abs: 4140 cells/uL (ref 1500–7800)
Neutrophils Relative %: 60 %
Platelets: 227 10*3/uL (ref 140–400)
RBC: 5.43 10*6/uL (ref 4.20–5.80)
RDW: 13.2 % (ref 11.0–15.0)
Total Lymphocyte: 27.3 %
WBC: 6.9 10*3/uL (ref 3.8–10.8)

## 2020-04-15 LAB — LIPID PANEL
Cholesterol: 195 mg/dL (ref <200)
HDL: 36 mg/dL — ABNORMAL LOW (ref >=40)
LDL Cholesterol (Calc): 122 mg/dL (calc) — ABNORMAL HIGH
Non-HDL Cholesterol (Calc): 159 mg/dL (calc) — ABNORMAL HIGH (ref <130)
Total CHOL/HDL Ratio: 5.4 (calc) — ABNORMAL HIGH (ref <5.0)
Triglycerides: 258 mg/dL — ABNORMAL HIGH (ref <150)

## 2020-04-15 LAB — HEMOGLOBIN A1C
Hgb A1c MFr Bld: 5.9 % of total Hgb — ABNORMAL HIGH (ref <5.7)
Mean Plasma Glucose: 123 mg/dL
eAG (mmol/L): 6.8 mmol/L

## 2020-04-15 LAB — TSH: TSH: 1.13 mIU/L (ref 0.40–4.50)

## 2020-05-09 ENCOUNTER — Other Ambulatory Visit: Payer: Self-pay | Admitting: Internal Medicine

## 2020-05-09 DIAGNOSIS — N401 Enlarged prostate with lower urinary tract symptoms: Secondary | ICD-10-CM

## 2020-07-26 ENCOUNTER — Other Ambulatory Visit: Payer: Self-pay | Admitting: Internal Medicine

## 2020-07-26 DIAGNOSIS — E782 Mixed hyperlipidemia: Secondary | ICD-10-CM

## 2020-07-26 DIAGNOSIS — F411 Generalized anxiety disorder: Secondary | ICD-10-CM

## 2020-07-26 NOTE — Telephone Encounter (Signed)
Medication Refill - Medication:  PARoxetine (PAXIL) 40 MG tablet ,  1(pill lett) patient did not call pharmacy because its a new pharmacy. Also patient would like omega-3 acid ethyl esters (LOVAZA) 1 g capsule     Has the patient contacted their pharmacy? No.   (Agent: If no, request that the patient contact the pharmacy for the refill.)    Preferred Pharmacy (with phone number or street name):  [id number] [address], [place] Phone:  [phone]  Fax:  [phone]       Agent: Please be advised that RX refills may take up to 3 business days. We ask that you follow-up with your pharmacy.

## 2020-07-26 NOTE — Telephone Encounter (Signed)
Requested medication (s) are due for refill today:   Requested medication (s) are on the active medication list: Yes  Last refill:  11/21/19 and 12/26/19  Future visit scheduled: No  Notes to clinic:  Unable to refill per protocol, last refill by another provider.      Requested Prescriptions  Pending Prescriptions Disp Refills   PARoxetine (PAXIL) 40 MG tablet 90 tablet 1    Sig: TAKE 1 TABLET BY MOUTH EVERY DAY IN THE MORNING      Psychiatry:  Antidepressants - SSRI Passed - 07/26/2020 10:32 AM      Passed - Valid encounter within last 6 months    Recent Outpatient Visits           3 months ago Hyperglycemia   [facility] Towanda Malkin, MD   8 months ago Screening for colorectal cancer   [facility] Towanda Malkin, MD   1 year ago Annual physical exam   [place], [place], [place]   1 year ago Dyslipidemia   [place], NP   2 years ago Need for influenza vaccination   [facility] L[place], Satira Anis, MD                  omega-3 acid ethyl esters (LOVAZA) 1 g capsule 360 capsule 1    Sig: Take 2 capsules (2 g total) by mouth 2 (two) times daily.      Endocrinology:  Nutritional Agents Passed - 07/26/2020 10:32 AM      Passed - Valid encounter within last 12 months    Recent Outpatient Visits           3 months ago Hyperglycemia   [facility] Towanda Malkin, MD   8 months ago Screening for colorectal cancer   [facility] Towanda Malkin, MD   1 year ago Annual physical exam   [place], [place], [place]   1 year ago Dyslipidemia   [place], NP   2 years ago Need for influenza vaccination   [facility] Lada, Satira Anis, MD

## 2020-07-28 ENCOUNTER — Other Ambulatory Visit: Payer: Self-pay | Admitting: Internal Medicine

## 2020-07-28 DIAGNOSIS — F411 Generalized anxiety disorder: Secondary | ICD-10-CM

## 2020-07-28 MED ORDER — PAROXETINE HCL 40 MG PO TABS: ORAL_TABLET | ORAL | 0 refills | Status: DC

## 2020-07-28 NOTE — Telephone Encounter (Signed)
Requested medication (s) are due for refill today:   Yes  Requested medication (s) are on the active medication list:   Yes  Future visit scheduled:   No  Last seen by Dr. Roxan Hockey on 04/14/2020.   Has not been seen by another provider.   Last ordered: 12/23/2019 #90, 1 refill  Clinic note:   Returned because he hasn't been seen by another provider since Dr. Roxan Hockey left.     Requested Prescriptions  Pending Prescriptions Disp Refills   PARoxetine (PAXIL) 40 MG tablet 90 tablet 1    Sig: TAKE 1 TABLET BY MOUTH EVERY DAY IN THE MORNING      Psychiatry:  Antidepressants - SSRI Passed - 07/28/2020  9:27 AM      Passed - Valid encounter within last 6 months    Recent Outpatient Visits           3 months ago Hyperglycemia   [facility] Towanda Malkin, MD   8 months ago Screening for colorectal cancer   [facility] Towanda Malkin, MD   1 year ago Annual physical exam   [place], [place], [place]   1 year ago Dyslipidemia   [place], NP   2 years ago Need for influenza vaccination   [facility] Lada, Satira Anis, MD

## 2020-07-28 NOTE — Telephone Encounter (Signed)
Copied from [place] [id number]. Topic: Quick Communication - Rx Refill/Question >> Jul 28, 2020  9:21 AM Tessa Lerner A wrote: Medication: PARoxetine (PAXIL) 40 MG tablet - patient has 0(zero) tablets remaining  Has the patient contacted their pharmacy? Yes. Pharmacy notified patient about their medication and directed them to contact their PCP  Preferred Pharmacy (with phone number or street name): [id number], [place]  Phone:  [phone]  Agent: Please be advised that RX refills may take up to 3 business days. We ask that you follow-up with your pharmacy.

## 2020-08-17 ENCOUNTER — Ambulatory Visit: Payer: 59 | Admitting: Physician Assistant

## 2020-08-20 ENCOUNTER — Other Ambulatory Visit: Payer: Self-pay

## 2020-08-20 ENCOUNTER — Ambulatory Visit: Payer: Self-pay | Admitting: Family Medicine

## 2020-08-20 ENCOUNTER — Encounter: Payer: Self-pay | Admitting: Family Medicine

## 2020-08-20 DIAGNOSIS — E782 Mixed hyperlipidemia: Secondary | ICD-10-CM

## 2020-08-20 DIAGNOSIS — F411 Generalized anxiety disorder: Secondary | ICD-10-CM

## 2020-08-20 DIAGNOSIS — R350 Frequency of micturition: Secondary | ICD-10-CM

## 2020-08-20 DIAGNOSIS — N401 Enlarged prostate with lower urinary tract symptoms: Secondary | ICD-10-CM

## 2020-08-20 MED ORDER — OMEGA-3-ACID ETHYL ESTERS 1 G PO CAPS: 2.0000 g | ORAL_CAPSULE | Freq: Two times a day (BID) | ORAL | 1 refills | Status: DC

## 2020-08-20 MED ORDER — PAROXETINE HCL 40 MG PO TABS: ORAL_TABLET | ORAL | 0 refills | Status: DC

## 2020-08-20 MED ORDER — TAMSULOSIN HCL 0.4 MG PO CAPS: 0.4000 mg | ORAL_CAPSULE | Freq: Every day | ORAL | 1 refills | Status: DC

## 2020-08-20 NOTE — Progress Notes (Signed)
4/22/20228:16 AM  Kenneth Moreno [birth year], 50 y.o., male [id number]  Chief Complaint  Patient presents with  . Follow-up    HPI:   Patient is a 50 y.o. male with past medical history significant for HLD who presents today for chronic care follow up.  Last visit 04/14/20 Denies any acute issues  HLD [place] daily Reports good medication compliance Lab Results  Component Value Date   CHOL 195 04/14/2020   HDL 36 (L) 04/14/2020   LDLCALC 122 (H) 04/14/2020   TRIG 258 (H) 04/14/2020   CHOLHDL 5.4 (H) 04/14/2020    Anxiety Paroxetine Feels this is well controlled  Lower Urinary Symptoms Tamulosin daily Up one or two times a night  Never did his colonoscopy Doesn't have health insurance right now Is self employed as an Clinical biochemist  Patient Care Team: Towanda Malkin, MD as PCP - General (Internal Medicine) Cammie Sickle, MD as Consulting Physician (Oncology) Oncology manages splenomegaly  Depression screen [facility] 2/9 08/20/2020 04/14/2020 11/13/2019  Decreased Interest 0 0 0  Down, Depressed, Hopeless 0 0 0  PHQ - 2 Score 0 0 0  Altered sleeping - - 0  Tired, decreased energy - - 0  Change in appetite - - 0  Feeling bad or failure about yourself  - - 0  Trouble concentrating - - 0  Moving slowly or fidgety/restless - - 0  Suicidal thoughts - - 0  PHQ-9 Score - - 0  Difficult doing work/chores - - Not difficult at all   GAD 7 : Generalized Anxiety Score 11/13/2019 05/23/2018  Nervous, Anxious, on Edge 0 0  Control/stop worrying 0 0  Worry too much - different things 0 0  Trouble relaxing 0 0  Restless 1 0  Easily annoyed or irritable 0 0  Afraid - awful might happen 0 0  Total GAD 7 Score 1 0  Anxiety Difficulty Not difficult at all Not difficult at all     Fall Risk  08/20/2020 04/14/2020 11/13/2019 05/15/2019 11/11/2018  Falls in the past year? 0 0 0 0 0  Number falls in past yr: 0 0 0 0 0  Injury with Fall? 0 0 0 0 0  Follow up - - -  Falls evaluation completed -     Allergies  Allergen Reactions  . Penicillins Diarrhea    Prior to Admission medications   Medication Sig Start Date End Date Taking? Authorizing Provider  omega-3 acid ethyl esters (LOVAZA) 1 g capsule Take 2 capsules (2 g total) by mouth 2 (two) times daily. 11/21/19  Yes Towanda Malkin, MD  PARoxetine (PAXIL) 40 MG tablet TAKE 1 TABLET BY MOUTH EVERY DAY IN THE MORNING 07/28/20  Yes Sowles, Drue Stager, MD  tamsulosin (FLOMAX) 0.4 MG CAPS capsule TAKE 1 CAPSULE BY MOUTH EVERY DAY 05/10/20  Yes Towanda Malkin, MD    Past Medical History:  Diagnosis Date  . Airway hyperreactivity   . Anxiety   . Asthma   . Depression   . Hematuria   . HLD (hyperlipidemia)   . Hypertriglyceridemia   . Kidney stone on right side   . Kidney stones 09/01/2015  . Microscopic hematuria   . Obesity   . Post-nasal drainage   . Renal disorder   . Restless leg   . Right ureteral stone   . RUQ pain   . Sleep apnea   . Splenomegaly   . Vertigo, benign positional     Past Surgical History:  Procedure Laterality  Date  . ganglion cyst removal  2006  . LITHOTRIPSY    . SINUS SURGERY WITH [place]  . TONSILLECTOMY  1990    Social History   Tobacco Use  . Smoking status: Never Smoker  . Smokeless tobacco: Never Used  Substance Use Topics  . Alcohol use: No    Family History  Problem Relation Age of Onset  . Colon cancer Brother   . Colon cancer Maternal Uncle   . Diabetes Mellitus II Father   . Hyperlipidemia Father   . COPD Father   . Hypertension Daughter   . Prostate cancer Paternal Uncle   . Gallbladder disease Mother   . Hearing loss Mother        one of her ears. pt not sure which one  . Panic disorder Sister   . Hyperlipidemia Maternal Grandmother   . Gallbladder disease Maternal Grandmother   . Hyperlipidemia Maternal Grandfather   . Congestive Heart Failure Paternal Grandfather   . Hyperlipidemia Paternal Grandfather   .  Diabetes Paternal Grandfather   . Kidney disease Neg Hx     Review of Systems  Respiratory: Negative.   Cardiovascular: Negative.   Musculoskeletal: Negative.   Neurological: Negative.   Psychiatric/Behavioral: Negative.      OBJECTIVE:  Today's Vitals   08/20/20 0757  BP: 132/78  Pulse: 74  Resp: 16  Temp: 98.2 F (36.8 C)  TempSrc: Oral  SpO2: 98%  Weight: 279 lb (126.6 kg)  Height: 6\' 1"  (1.854 m)   Body mass index is 36.81 kg/m.   Physical Exam Vitals reviewed.  Constitutional:      Appearance: Normal appearance.  HENT:     Head: Normocephalic and atraumatic.  Eyes:     Conjunctiva/sclera: Conjunctivae normal.     Pupils: Pupils are equal, round, and reactive to light.  Cardiovascular:     Rate and Rhythm: Normal rate and regular rhythm.     Pulses: Normal pulses.     Heart sounds: Normal heart sounds. No murmur heard. No friction rub. No gallop.   Pulmonary:     Effort: Pulmonary effort is normal. No respiratory distress.     Breath sounds: Normal breath sounds. No stridor. No wheezing or rales.  Abdominal:     General: Bowel sounds are normal.     Palpations: Abdomen is soft.     Tenderness: There is no abdominal tenderness.  Musculoskeletal:     Right lower leg: No edema.     Left lower leg: No edema.  Skin:    General: Skin is warm and dry.  Neurological:     General: No focal deficit present.     Mental Status: He is alert and oriented to person, place, and time.  Psychiatric:        Mood and Affect: Mood normal.        Behavior: Behavior normal.     No results found for this or any previous visit (from the past 24 hour(s)).  No results found.   ASSESSMENT and PLAN  Problem List Items Addressed This Visit      Other   Anxiety, generalized   Relevant Medications   PARoxetine (PAXIL) 40 MG tablet   Other Relevant Orders   Comprehensive metabolic panel   Mixed hyperlipidemia   Relevant Medications   omega-3 acid ethyl esters  (LOVAZA) 1 g capsule   Other Relevant Orders   Lipid Panel   Benign prostatic hyperplasia with urinary frequency   Relevant Medications  tamsulosin (FLOMAX) 0.4 MG CAPS capsule      Plan . Medication refills sent . Chronic conditions stable on current regimens . Will follow up with lab results . Discussed resuming screening once has health insurance  . Encouraged to follow up if has any issues affording medications   Return in about 6 months (around 02/19/2021).    Huston Foley Laiba Fuerte, FNP-BC [facility]

## 2020-08-20 NOTE — Patient Instructions (Signed)
High Cholesterol  High cholesterol is a condition in which the blood has high levels of a white, waxy substance similar to fat (cholesterol). The liver makes all the cholesterol that the body needs. The human body needs small amounts of cholesterol to help build cells. A person gets extra or excess cholesterol from the food that he or she eats. The blood carries cholesterol from the liver to the rest of the body. If you have high cholesterol, deposits (plaques) may build up on the walls of your arteries. Arteries are the blood vessels that carry blood away from your heart. These plaques make the arteries narrow and stiff. Cholesterol plaques increase your risk for heart attack and stroke. Work with your health care provider to keep your cholesterol levels in a healthy range. What increases the risk? The following factors may make you more likely to develop this condition:  Eating foods that are high in animal fat (saturated fat) or cholesterol.  Being overweight.  Not getting enough exercise.  A family history of high cholesterol (familial hypercholesterolemia).  Use of tobacco products.  Having diabetes. What are the signs or symptoms? There are no symptoms of this condition. How is this diagnosed? This condition may be diagnosed based on the results of a blood test.  If you are older than 52 years of age, your health care provider may check your cholesterol levels every 4-6 years.  You may be checked more often if you have high cholesterol or other risk factors for heart disease. The blood test for cholesterol measures:  "Bad" cholesterol, or LDL cholesterol. This is the main type of cholesterol that causes heart disease. The desired level is less than 100 mg/dL.  "Good" cholesterol, or HDL cholesterol. HDL helps protect against heart disease by cleaning the arteries and carrying the LDL to the liver for processing. The desired level for HDL is 60 mg/dL or higher.  Triglycerides.  These are fats that your body can store or burn for energy. The desired level is less than 150 mg/dL.  Total cholesterol. This measures the total amount of cholesterol in your blood and includes LDL, HDL, and triglycerides. The desired level is less than 200 mg/dL. How is this treated? This condition may be treated with:  Diet changes. You may be asked to eat foods that have more fiber and less saturated fats or added sugar.  Lifestyle changes. These may include regular exercise, maintaining a healthy weight, and quitting use of tobacco products.  Medicines. These are given when diet and lifestyle changes have not worked. You may be prescribed a statin medicine to help lower your cholesterol levels. Follow these instructions at home: Eating and drinking  Eat a healthy, balanced diet. This diet includes: ? Daily servings of a variety of fresh, frozen, or canned fruits and vegetables. ? Daily servings of whole grain foods that are rich in fiber. ? Foods that are low in saturated fats and trans fats. These include poultry and fish without skin, lean cuts of meat, and low-fat dairy products. ? A variety of fish, especially oily fish that contain omega-3 fatty acids. Aim to eat fish at least 2 times a week.  Avoid foods and drinks that have added sugar.  Use healthy cooking methods, such as roasting, grilling, broiling, baking, poaching, steaming, and stir-frying. Do not fry your food except for stir-frying.   Lifestyle  Get regular exercise. Aim to exercise for a total of 150 minutes a week. Increase your activity level by doing activities   such as gardening, walking, and taking the stairs.  Do not use any products that contain nicotine or tobacco, such as cigarettes, e-cigarettes, and chewing tobacco. If you need help quitting, ask your health care provider.   General instructions  Take over-the-counter and prescription medicines only as told by your health care provider.  Keep all  follow-up visits as told by your health care provider. This is important. Where to find more information  American Heart Association: www.heart.org  National Heart, Lung, and Blood Institute: www.nhlbi.nih.gov Contact a health care provider if:  You have trouble achieving or maintaining a healthy diet or weight.  You are starting an exercise program.  You are unable to stop smoking. Get help right away if:  You have chest pain.  You have trouble breathing.  You have any symptoms of a stroke. "BE FAST" is an easy way to remember the main warning signs of a stroke: ? B - Balance. Signs are dizziness, sudden trouble walking, or loss of balance. ? E - Eyes. Signs are trouble seeing or a sudden change in vision. ? F - Face. Signs are sudden weakness or numbness of the face, or the face or eyelid drooping on one side. ? A - Arms. Signs are weakness or numbness in an arm. This happens suddenly and usually on one side of the body. ? S - Speech. Signs are sudden trouble speaking, slurred speech, or trouble understanding what people say. ? T - Time. Time to call emergency services. Write down what time symptoms started.  You have other signs of a stroke, such as: ? A sudden, severe headache with no known cause. ? Nausea or vomiting. ? Seizure. These symptoms may represent a serious problem that is an emergency. Do not wait to see if the symptoms will go away. Get medical help right away. Call your local emergency services (911 in the [place] Do not drive yourself to the hospital. Summary  Cholesterol plaques increase your risk for heart attack and stroke. Work with your health care provider to keep your cholesterol levels in a healthy range.  Eat a healthy, balanced diet, get regular exercise, and maintain a healthy weight.  Do not use any products that contain nicotine or tobacco, such as cigarettes, e-cigarettes, and chewing tobacco.  Get help right away if you have any symptoms of a  stroke. This information is not intended to replace advice given to you by your health care provider. Make sure you discuss any questions you have with your health care provider. Document Revised: 03/17/2019 Document Reviewed: 03/17/2019 Elsevier Patient Education  2021 Elsevier Inc.  

## 2020-08-21 LAB — COMPREHENSIVE METABOLIC PANEL
AG Ratio: 2.3 (calc) (ref 1.0–2.5)
ALT: 38 U/L (ref 9–46)
AST: 17 U/L (ref 10–35)
Albumin: 4.6 g/dL (ref 3.6–5.1)
Alkaline phosphatase (APISO): 54 U/L (ref 35–144)
BUN: 17 mg/dL (ref 7–25)
CO2: 29 mmol/L (ref 20–32)
Calcium: 9.4 mg/dL (ref 8.6–10.3)
Chloride: 103 mmol/L (ref 98–110)
Creat: 0.97 mg/dL (ref 0.70–1.33)
Globulin: 2 g/dL (calc) (ref 1.9–3.7)
Glucose, Bld: 123 mg/dL — ABNORMAL HIGH (ref 65–99)
Potassium: 5 mmol/L (ref 3.5–5.3)
Sodium: 140 mmol/L (ref 135–146)
Total Bilirubin: 0.5 mg/dL (ref 0.2–1.2)
Total Protein: 6.6 g/dL (ref 6.1–8.1)

## 2020-08-21 LAB — LIPID PANEL
Cholesterol: 199 mg/dL (ref <200)
HDL: 36 mg/dL — ABNORMAL LOW (ref >=40)
LDL Cholesterol (Calc): 118 mg/dL (calc) — ABNORMAL HIGH
Non-HDL Cholesterol (Calc): 163 mg/dL (calc) — ABNORMAL HIGH (ref <130)
Total CHOL/HDL Ratio: 5.5 (calc) — ABNORMAL HIGH (ref <5.0)
Triglycerides: 314 mg/dL — ABNORMAL HIGH (ref <150)

## 2020-08-24 NOTE — Progress Notes (Signed)
Overall labs look good. Cholesterol is quite high. I would recommend starting a daily statin for this, let me know if this is something you are interested in or have any questions about.

## 2020-10-15 ENCOUNTER — Other Ambulatory Visit: Payer: Self-pay | Admitting: Internal Medicine

## 2020-10-15 DIAGNOSIS — F411 Generalized anxiety disorder: Secondary | ICD-10-CM

## 2020-10-15 MED ORDER — PAROXETINE HCL 40 MG PO TABS: ORAL_TABLET | ORAL | 3 refills | Status: DC

## 2020-10-15 NOTE — Telephone Encounter (Signed)
   Notes to clinic: medication was last filled on 08/20/2020 for 30 day Review for refill Patient needs refilled today Last seen by Huston Foley Just    Requested Prescriptions  Pending Prescriptions Disp Refills   PARoxetine (PAXIL) 40 MG tablet 30 tablet 0    Sig: TAKE 1 TABLET BY MOUTH EVERY DAY IN THE MORNING      Psychiatry:  Antidepressants - SSRI Passed - 10/15/2020  9:33 AM      Passed - Valid encounter within last 6 months    Recent Outpatient Visits           1 month ago Mixed hyperlipidemia   [facility] Just, Laurita Quint, FNP   6 months ago Hyperglycemia   [facility] Towanda Malkin, MD   11 months ago Screening for colorectal cancer   [facility] Towanda Malkin, MD   1 year ago Annual physical exam   [place], [place], [place]   1 year ago Dyslipidemia   [place], Bethel Born, NP       Future Appointments             In 4 months Delsa Grana, PA-C [facility], [facility]

## 2020-10-15 NOTE — Telephone Encounter (Signed)
Pt has been trying to get refill for a week / pharmacy advised them to call office / request was sent to [facility] Just / Pt needs refill today if possible /   Refill is for PARoxetine (PAXIL) 40 MG tablet and needs sent to  [place], [address] AT [place] Phone:  [phone]  Fax:  [phone]    Pts last f/u appt was in April with Huston Foley and next appt with Kristeen Miss is in October

## 2020-10-15 NOTE — Telephone Encounter (Signed)
Last appt 08-20-2020 and next appt is 02/21/2021 at  8:40 AM

## 2020-12-14 ENCOUNTER — Other Ambulatory Visit: Payer: Self-pay

## 2020-12-14 DIAGNOSIS — N401 Enlarged prostate with lower urinary tract symptoms: Secondary | ICD-10-CM

## 2020-12-14 MED ORDER — TAMSULOSIN HCL 0.4 MG PO CAPS
0.4000 mg | ORAL_CAPSULE | Freq: Every day | ORAL | 1 refills | Status: DC
Start: 2020-12-14 — End: 2021-05-26

## 2021-02-21 ENCOUNTER — Ambulatory Visit: Payer: Self-pay | Admitting: Family Medicine

## 2021-02-23 ENCOUNTER — Other Ambulatory Visit: Payer: Self-pay

## 2021-02-23 ENCOUNTER — Ambulatory Visit: Payer: Self-pay | Admitting: Family Medicine

## 2021-02-23 ENCOUNTER — Encounter: Payer: Self-pay | Admitting: Family Medicine

## 2021-02-23 VITALS — BP 106/58 | HR 83 | Temp 98.0°F | Resp 16 | Ht 73.0 in | Wt 273.1 lb

## 2021-02-23 DIAGNOSIS — R739 Hyperglycemia, unspecified: Secondary | ICD-10-CM

## 2021-02-23 DIAGNOSIS — Z6837 Body mass index (BMI) 37.0-37.9, adult: Secondary | ICD-10-CM

## 2021-02-23 DIAGNOSIS — G4700 Insomnia, unspecified: Secondary | ICD-10-CM

## 2021-02-23 DIAGNOSIS — Z23 Encounter for immunization: Secondary | ICD-10-CM

## 2021-02-23 DIAGNOSIS — N401 Enlarged prostate with lower urinary tract symptoms: Secondary | ICD-10-CM

## 2021-02-23 DIAGNOSIS — Z125 Encounter for screening for malignant neoplasm of prostate: Secondary | ICD-10-CM

## 2021-02-23 DIAGNOSIS — E669 Obesity, unspecified: Secondary | ICD-10-CM

## 2021-02-23 DIAGNOSIS — R350 Frequency of micturition: Secondary | ICD-10-CM

## 2021-02-23 DIAGNOSIS — E785 Hyperlipidemia, unspecified: Secondary | ICD-10-CM

## 2021-02-23 DIAGNOSIS — R0609 Other forms of dyspnea: Secondary | ICD-10-CM | POA: Insufficient documentation

## 2021-02-23 DIAGNOSIS — F411 Generalized anxiety disorder: Secondary | ICD-10-CM

## 2021-02-23 DIAGNOSIS — G4733 Obstructive sleep apnea (adult) (pediatric): Secondary | ICD-10-CM

## 2021-02-23 NOTE — Assessment & Plan Note (Addendum)
Exam wnl today. Likely 2/2 deconditioning however given risk factors and progressive nature, will refer to Cardiology for evaluation and possible stress testing. Also obtaining labs today.

## 2021-02-23 NOTE — Progress Notes (Signed)
SUBJECTIVE:   CHIEF COMPLAINT / HPI:   Benign Prostatic Hypertrophy - Medications: flomax 0.4mg  - Symptoms: none - Denies: incomplete emptying, straining, and weak stream gross hematuria - no personal history and no family history of prostate cancer  HLD - medications: Lovaza - was previously on a statin - compliance: good - medication SEs: none  Dyspnea on exertion - Getting easily winded with faster heart rate, progressively worsening over the last 6 months. Eases with rest. Previously had stress testing years ago. Cardiac risk factors include prediabetes, HLD, obesity. Is never smoker. Denies nausea, chest pain. Some sweating. No early cardiac FH.  INSOMNIA Duration:  about a year Satisfied with sleep quality: no Difficulty falling asleep: yes, hard to turn mind off Difficulty staying asleep: yes Waking a few hours after sleep onset: yes Wakes feeling refreshed:  sometimes Good sleep hygiene:  working on it.   - Has regular bedtime around 10-11pm.  - Tries to limit screen time but has a lot of screens and calls with work - no caffeine use - about an hour before bed has tried showering, sitting and watching TV Apnea: yes Snoring:  minimal s/p OSA surgery 17-18 years ago Depressed/anxious mood: yes Recent stress: yes Restless legs/nocturnal leg cramps: no Chronic pain/arthritis: no History of sleep study:  many years ago, not since surgery Treatments attempted: took xanax many years ago. Tried sleep aid but felt groggy the next day.    Anxiety - Medications: paxil 40mg  - since early 20s - Taking: good compliance - Symptoms: trouble sleeping (see above) - Current stressors: running 2 businesses - Coping Mechanisms: sit and watch a little TV, go for a walk  GAD 7 : Generalized Anxiety Score 02/23/2021 11/13/2019 05/23/2018  Nervous, Anxious, on Edge 0 0 0  Control/stop worrying 0 0 0  Worry too much - different things 0 0 0  Trouble relaxing 1 0 0  Restless 0 1 0   Easily annoyed or irritable 0 0 0  Afraid - awful might happen 0 0 0  Total GAD 7 Score 1 1 0  Anxiety Difficulty Not difficult at all Not difficult at all Not difficult at all     OBJECTIVE:   BP (!) 106/58   Pulse 83   Temp 98 F (36.7 C) (Oral)   Resp 16   Ht 6\' 1"  (1.854 m)   Wt 273 lb 1.6 oz (123.9 kg)   SpO2 95%   BMI 36.03 kg/m   Gen: well appearing, in NAD, obese Card: RRR Lungs: CTAB Abd: soft, NTND, +BS Ext: WWP, no edema   ASSESSMENT/PLAN:   Apnea, sleep S/p ENT surgery many years ago, no sleep study since. Will repeat sleep study given insomnia  Dyslipidemia Will likely require statin, repeat fasting labs today.  Hyperglycemia Obtain A1c. Continue efforts with weight loss.  Anxiety, generalized Overall doing well on current regimen but with some insomnia (see separate problem). Continue paxil.   Class 2 severe obesity due to excess calories with serious comorbidity and body mass index (BMI) of 37.0 to 37.9 in adult [facility]) Contributing to hyperglycemia, HLD. Continue efforts with weight loss.   Benign prostatic hyperplasia with urinary frequency Doing well on current regimen, no changes made today.  Insomnia Likely 2/2 increased stress and constant stimulation with work. Discussed sleep hygiene extensively, trial of melatonin. Repeat sleep study given h/o OSA. Obtain labs to r/o other contributors.  F/u in 1 month.  DOE (dyspnea on exertion) Exam wnl today. Likely 2/2  deconditioning however given risk factors and progressive nature, will refer to Cardiology for evaluation and possible stress testing. Also obtaining labs today.     Myles Gip, DO

## 2021-02-23 NOTE — Assessment & Plan Note (Signed)
Overall doing well on current regimen but with some insomnia (see separate problem). Continue paxil.

## 2021-02-23 NOTE — Assessment & Plan Note (Signed)
Obtain A1c. Continue efforts with weight loss.

## 2021-02-23 NOTE — Assessment & Plan Note (Signed)
Will likely require statin, repeat fasting labs today.

## 2021-02-23 NOTE — Assessment & Plan Note (Signed)
Likely 2/2 increased stress and constant stimulation with work. Discussed sleep hygiene extensively, trial of melatonin. Repeat sleep study given h/o OSA. Obtain labs to r/o other contributors.  F/u in 1 month.

## 2021-02-23 NOTE — Assessment & Plan Note (Signed)
Doing well on current regimen, no changes made today. 

## 2021-02-23 NOTE — Assessment & Plan Note (Addendum)
S/p ENT surgery many years ago, no sleep study since. Will repeat sleep study given insomnia

## 2021-02-23 NOTE — Patient Instructions (Addendum)
It was great to see you!  Our plans for today:  - See below for tips on helping to sleep better. - We are getting a sleep study. - We are referring you to Cardiology for stress testing.  - We may need to add a statin to help your cholesterol, we will call you with the results of your labs.   We are checking some labs today, we will call you with these results.  Take care and seek immediate care sooner if you develop any concerns.   Dr. Ky Barban  - Try the following to help you sleep better:  - limit naps during the day  - no screens (TV, phone, tablet, computer) at least 1-2 hours before bedtime.  - have a quiet and dark sleeping environment.  - no large meals or drinks about 1 hour before bed.  - Avoid taking diuretics (hydrochlorothiazide, furosemide) in the evenings.  - Avoid caffeine after 3pm.  - Exercise or move your body regularly every day.  - You can also try melatonin 5 mg over the counter. Take this 1-2 hours before bed. You can increase to 10mg  if this is not helpful. - If you are lying in bed for 30 mins-1 hour and aren't falling asleep, get out of bed and do something relaxing like reading (NO TV!) until you are tired.

## 2021-02-23 NOTE — Assessment & Plan Note (Signed)
Contributing to hyperglycemia, HLD. Continue efforts with weight loss.

## 2021-02-24 ENCOUNTER — Other Ambulatory Visit: Payer: Self-pay

## 2021-02-24 DIAGNOSIS — E785 Hyperlipidemia, unspecified: Secondary | ICD-10-CM

## 2021-02-24 LAB — CBC WITH DIFFERENTIAL/PLATELET
Absolute Monocytes: 615 cells/uL (ref 200–950)
Basophils Absolute: 38 cells/uL (ref 0–200)
Basophils Relative: 0.5 %
Eosinophils Absolute: 128 cells/uL (ref 15–500)
Eosinophils Relative: 1.7 %
HCT: 48.2 % (ref 38.5–50.0)
Hemoglobin: 16.1 g/dL (ref 13.2–17.1)
Lymphs Abs: 1808 cells/uL (ref 850–3900)
MCH: 28 pg (ref 27.0–33.0)
MCHC: 33.4 g/dL (ref 32.0–36.0)
MCV: 84 fL (ref 80.0–100.0)
MPV: 11.1 fL (ref 7.5–12.5)
Monocytes Relative: 8.2 %
Neutro Abs: 4913 cells/uL (ref 1500–7800)
Neutrophils Relative %: 65.5 %
Platelets: 258 10*3/uL (ref 140–400)
RBC: 5.74 10*6/uL (ref 4.20–5.80)
RDW: 13 % (ref 11.0–15.0)
Total Lymphocyte: 24.1 %
WBC: 7.5 10*3/uL (ref 3.8–10.8)

## 2021-02-24 LAB — COMPREHENSIVE METABOLIC PANEL
AG Ratio: 2 (calc) (ref 1.0–2.5)
ALT: 30 U/L (ref 9–46)
AST: 19 U/L (ref 10–35)
Albumin: 4.6 g/dL (ref 3.6–5.1)
Alkaline phosphatase (APISO): 54 U/L (ref 35–144)
BUN: 13 mg/dL (ref 7–25)
CO2: 24 mmol/L (ref 20–32)
Calcium: 9.6 mg/dL (ref 8.6–10.3)
Chloride: 104 mmol/L (ref 98–110)
Creat: 0.94 mg/dL (ref 0.70–1.30)
Globulin: 2.3 g/dL (calc) (ref 1.9–3.7)
Glucose, Bld: 109 mg/dL — ABNORMAL HIGH (ref 65–99)
Potassium: 4.8 mmol/L (ref 3.5–5.3)
Sodium: 139 mmol/L (ref 135–146)
Total Bilirubin: 0.5 mg/dL (ref 0.2–1.2)
Total Protein: 6.9 g/dL (ref 6.1–8.1)

## 2021-02-24 LAB — HEMOGLOBIN A1C
Hgb A1c MFr Bld: 5.6 % of total Hgb (ref <5.7)
Mean Plasma Glucose: 114 mg/dL
eAG (mmol/L): 6.3 mmol/L

## 2021-02-24 LAB — LIPID PANEL
Cholesterol: 197 mg/dL (ref <200)
HDL: 35 mg/dL — ABNORMAL LOW (ref >=40)
LDL Cholesterol (Calc): 119 mg/dL (calc) — ABNORMAL HIGH
Non-HDL Cholesterol (Calc): 162 mg/dL (calc) — ABNORMAL HIGH (ref <130)
Total CHOL/HDL Ratio: 5.6 (calc) — ABNORMAL HIGH (ref <5.0)
Triglycerides: 281 mg/dL — ABNORMAL HIGH (ref <150)

## 2021-02-24 LAB — PSA: PSA: 0.28 ng/mL (ref <=4.00)

## 2021-02-24 MED ORDER — ROSUVASTATIN CALCIUM 5 MG PO TABS: 5.0000 mg | ORAL_TABLET | Freq: Every day | ORAL | 3 refills | Status: DC

## 2021-02-24 NOTE — Addendum Note (Signed)
Addended by: Myles Gip on: 02/24/2021 02:32 PM   Modules accepted: Orders

## 2021-03-28 ENCOUNTER — Telehealth: Payer: Self-pay | Admitting: Internal Medicine

## 2021-05-26 ENCOUNTER — Ambulatory Visit: Payer: Self-pay | Admitting: Internal Medicine

## 2021-05-26 ENCOUNTER — Encounter: Payer: Self-pay | Admitting: Internal Medicine

## 2021-05-26 ENCOUNTER — Other Ambulatory Visit: Payer: Self-pay | Admitting: Internal Medicine

## 2021-05-26 VITALS — BP 122/80 | HR 100 | Temp 98.0°F | Resp 16 | Ht 73.0 in | Wt 275.6 lb

## 2021-05-26 DIAGNOSIS — N401 Enlarged prostate with lower urinary tract symptoms: Secondary | ICD-10-CM

## 2021-05-26 DIAGNOSIS — G4733 Obstructive sleep apnea (adult) (pediatric): Secondary | ICD-10-CM

## 2021-05-26 DIAGNOSIS — E782 Mixed hyperlipidemia: Secondary | ICD-10-CM

## 2021-05-26 DIAGNOSIS — R350 Frequency of micturition: Secondary | ICD-10-CM

## 2021-05-26 DIAGNOSIS — G4700 Insomnia, unspecified: Secondary | ICD-10-CM

## 2021-05-26 DIAGNOSIS — F411 Generalized anxiety disorder: Secondary | ICD-10-CM

## 2021-05-26 MED ORDER — TAMSULOSIN HCL 0.4 MG PO CAPS: 0.4000 mg | ORAL_CAPSULE | Freq: Every day | ORAL | 0 refills | Status: DC

## 2021-05-26 MED ORDER — HYDROXYZINE HCL 10 MG PO TABS: 10.0000 mg | ORAL_TABLET | Freq: Three times a day (TID) | ORAL | 1 refills | Status: DC | PRN

## 2021-05-26 MED ORDER — PAROXETINE HCL 40 MG PO TABS
ORAL_TABLET | ORAL | 0 refills | Status: DC
Start: 2021-05-26 — End: 2021-08-24

## 2021-05-26 NOTE — Telephone Encounter (Signed)
Requested Prescriptions  Pending Prescriptions Disp Refills   PARoxetine (PAXIL) 40 MG tablet 90 tablet 3    Sig: TAKE 1 TABLET BY MOUTH EVERY DAY IN THE MORNING     Psychiatry:  Antidepressants - SSRI Passed - 05/26/2021 12:39 PM      Passed - Valid encounter within last 6 months    Recent Outpatient Visits          Today Mixed hyperlipidemia   [place], DO   3 months ago Hyperglycemia   [facility] Rory Percy M, DO   9 months ago Mixed hyperlipidemia   [facility] Just, Laurita Quint, FNP   1 year ago Hyperglycemia   [facility] Towanda Malkin, MD   1 year ago Screening for colorectal cancer   [place], MD      Future Appointments            In 3 months Teodora Medici, [facility], [facility]            tamsulosin (FLOMAX) 0.4 MG CAPS capsule 90 capsule 1    Sig: Take 1 capsule (0.4 mg total) by mouth daily.     Urology: Alpha-Adrenergic Blocker Passed - 05/26/2021 12:39 PM      Passed - Last BP in normal range    BP Readings from Last 1 Encounters:  05/26/21 122/80         Passed - Valid encounter within last 12 months    Recent Outpatient Visits          Today Mixed hyperlipidemia   [place], DO   3 months ago Hyperglycemia   [place], DO   9 months ago Mixed hyperlipidemia   [facility] Just, Laurita Quint, FNP   1 year ago Hyperglycemia   [place], MD   1 year ago Screening for colorectal cancer   [facility] Towanda Malkin, MD      Future Appointments            In 3 months Teodora Medici, [facility], [facility]

## 2021-05-26 NOTE — Patient Instructions (Addendum)
It was great seeing you today!  Plan discussed at today's visit: -Continue Crestor 5 mg, recommend going back on Lovaza to help bring down triglycerides  -Referral to sleep specialist ordered, today someone will call you to schedule this test -Recommend trying over the counter Melatonin, take it 30 minutes before sleep  -Hydroxyzine to take as needed for anxiety, but this will make you sleepy  Follow up in: 3 months  Take care and let us know if you have any questions or concerns prior to your next visit.  Dr. Rosana Berger  Insomnia Insomnia is a sleep disorder that makes it difficult to fall asleep or stay asleep. Insomnia can cause fatigue, low energy, difficulty concentrating, mood swings, and poor performance at work or school. There are three different ways to classify insomnia: Difficulty falling asleep. Difficulty staying asleep. Waking up too early in the morning. Any type of insomnia can be long-term (chronic) or short-term (acute). Both are common. Short-term insomnia usually lasts for three months or less. Chronic insomnia occurs at least three times a week for longer than three months. What are the causes? Insomnia may be caused by another condition, situation, or substance, such as: Anxiety. Certain medicines. Gastroesophageal reflux disease (GERD) or other gastrointestinal conditions. Asthma or other breathing conditions. Restless legs syndrome, sleep apnea, or other sleep disorders. Chronic pain. Menopause. Stroke. Abuse of alcohol, tobacco, or illegal drugs. Mental health conditions, such as depression. Caffeine. Neurological disorders, such as Alzheimer's disease. An overactive thyroid (hyperthyroidism). Sometimes, the cause of insomnia may not be known. What increases the risk? Risk factors for insomnia include: Gender. Women are affected more often than men. Age. Insomnia is more common as you get older. Stress. Lack of exercise. Irregular work schedule or  working night shifts. Traveling between different time zones. Certain medical and mental health conditions. What are the signs or symptoms? If you have insomnia, the main symptom is having trouble falling asleep or having trouble staying asleep. This may lead to other symptoms, such as: Feeling fatigued or having low energy. Feeling nervous about going to sleep. Not feeling rested in the morning. Having trouble concentrating. Feeling irritable, anxious, or depressed. How is this diagnosed? This condition may be diagnosed based on: Your symptoms and medical history. Your health care provider may ask about: Your sleep habits. Any medical conditions you have. Your mental health. A physical exam. How is this treated? Treatment for insomnia depends on the cause. Treatment may focus on treating an underlying condition that is causing insomnia. Treatment may also include: Medicines to help you sleep. Counseling or therapy. Lifestyle adjustments to help you sleep better. Follow these instructions at home: Eating and drinking Limit or avoid alcohol, caffeinated beverages, and cigarettes, especially close to bedtime. These can disrupt your sleep. Do not eat a large meal or eat spicy foods right before bedtime. This can lead to digestive discomfort that can make it hard for you to sleep. Sleep habits Keep a sleep diary to help you and your health care provider figure out what could be causing your insomnia. Write down: When you sleep. When you wake up during the night. How well you sleep. How rested you feel the next day. Any side effects of medicines you are taking. What you eat and drink. Make your bedroom a dark, comfortable place where it is easy to fall asleep. Put up shades or blackout curtains to block light from outside. Use a white noise machine to block noise. Keep the temperature cool. Limit screen  use before bedtime. This includes: Watching TV. Using your smartphone, tablet,  or computer. Stick to a routine that includes going to bed and waking up at the same times every day and night. This can help you fall asleep faster. Consider making a quiet activity, such as reading, part of your nighttime routine. Try to avoid taking naps during the day so that you sleep better at night. Get out of bed if you are still awake after 15 minutes of trying to sleep. Keep the lights down, but try reading or doing a quiet activity. When you feel sleepy, go back to bed. General instructions Take over-the-counter and prescription medicines only as told by your health care provider. Exercise regularly, as told by your health care provider. Avoid exercise starting several hours before bedtime. Use relaxation techniques to manage stress. Ask your health care provider to suggest some techniques that may work well for you. These may include: Breathing exercises. Routines to release muscle tension. Visualizing peaceful scenes. Make sure that you drive carefully. Avoid driving if you feel very sleepy. Keep all follow-up visits as told by your health care provider. This is important. Contact a health care provider if: You are tired throughout the day. You have trouble in your daily routine due to sleepiness. You continue to have sleep problems, or your sleep problems get worse. Get help right away if: You have serious thoughts about hurting yourself or someone else. If you ever feel like you may hurt yourself or others, or have thoughts about taking your own life, get help right away. You can go to your nearest emergency department or call: Your local emergency services (911 in the [place] A suicide crisis helpline, such as the [place] at 646-683-2169 or 988 in the [place]. This is open 24 hours a day. Summary Insomnia is a sleep disorder that makes it difficult to fall asleep or stay asleep. Insomnia can be long-term (chronic) or short-term (acute). Treatment for  insomnia depends on the cause. Treatment may focus on treating an underlying condition that is causing insomnia. Keep a sleep diary to help you and your health care provider figure out what could be causing your insomnia. This information is not intended to replace advice given to you by your health care provider. Make sure you discuss any questions you have with your health care provider. Document Revised: 11/10/2020 Document Reviewed: 02/26/2020 Elsevier Patient Education  2022 [employer].

## 2021-05-26 NOTE — Progress Notes (Signed)
Established Patient Office Visit  Subjective:  Patient ID: Kenneth Moreno, male    DOB: 01-22-1971  Age: 51 y.o. MRN: [id number]  CC:  Chief Complaint  Patient presents with   Follow-up   Hyperlipidemia   Depression    HPI Kenneth Moreno presents for follow up on chronic medical conditions.   HLD: -Medications: Crestor 5 new since last visit, no longer taking Lovaza  -Patient is compliant with above medications and reports no side effects.  -Last lipid panel: 10/22: TC 197, HDL 35, triglycerides 281, LDL 119  The 10-year ASCVD risk score (Arnett DK, et al., 2019) is: 11%   Values used to calculate the score:     Age: 11 years     Sex: Male     Is Non-Hispanic African American: No     Diabetic: No     Tobacco smoker: Yes     Systolic Blood Pressure: 366 mmHg     Is BP treated: No     HDL Cholesterol: 35 mg/dL     Total Cholesterol: 197 mg/dL  BPH: -Currently on Flomax daily, doing well   MDD/Anxiety: -Currently on Paxil 40 -Mood status: controlled -Satisfied with current treatment?: yes -Symptom severity: moderate  -Duration of current treatment : 20 years -Side effects: no Medication compliance: excellent compliance  Depression screen [facility] 2/9 05/26/2021 02/23/2021 08/20/2020 04/14/2020 11/13/2019  Decreased Interest 0 0 0 0 0  Down, Depressed, Hopeless 0 0 0 0 0  PHQ - 2 Score 0 0 0 0 0  Altered sleeping 0 3 - - 0  Tired, decreased energy 0 1 - - 0  Change in appetite 0 0 - - 0  Feeling bad or failure about yourself  0 0 - - 0  Trouble concentrating 0 0 - - 0  Moving slowly or fidgety/restless 0 0 - - 0  Suicidal thoughts 0 0 - - 0  PHQ-9 Score 0 4 - - 0  Difficult doing work/chores Not difficult at all Not difficult at all - - Not difficult at all  Some recent data might be hidden    At last visit was also complaining about insomnia - was started on Melatonin and repeat sleep study which was not obtained. He states he did not try the melatonin and was  not contacted about a sleep study.  Health Maintenance: -Blood work up to date -Colon cancer screening: colonoscopy 2014   Past Medical History:  Diagnosis Date   Airway hyperreactivity    Anxiety    Asthma    Depression    Hematuria    HLD (hyperlipidemia)    Hypertriglyceridemia    Kidney stone on right side    Kidney stones 09/01/2015   Microscopic hematuria    Obesity    Post-nasal drainage    Renal disorder    Restless leg    Right ureteral stone    RUQ pain    Sleep apnea    Splenomegaly    Vertigo, benign positional     Past Surgical History:  Procedure Laterality Date   ganglion cyst removal  2006   LITHOTRIPSY     SINUS SURGERY WITH INSTATRAK  1990   TONSILLECTOMY  1990    Family History  Problem Relation Age of Onset   Colon cancer Brother    Colon cancer Maternal Uncle    Diabetes Mellitus II Father    Hyperlipidemia Father    COPD Father    Hypertension Daughter  Prostate cancer Paternal Uncle    Gallbladder disease Mother    Hearing loss Mother        one of her ears. pt not sure which one   Panic disorder Sister    Hyperlipidemia Maternal Grandmother    Gallbladder disease Maternal Grandmother    Hyperlipidemia Maternal Grandfather    Congestive Heart Failure Paternal Grandfather    Hyperlipidemia Paternal Grandfather    Diabetes Paternal Grandfather    Kidney disease Neg Hx     Social History   Socioeconomic History   Marital status: Married    Spouse name: Jimmy   Number of children: 2   Years of education: Not on file   Highest education level: Not on file  Occupational History   Not on file  Tobacco Use   Smoking status: Never   Smokeless tobacco: Never  Vaping Use   Vaping Use: Never used  Substance and Sexual Activity   Alcohol use: No   Drug use: No   Sexual activity: Yes    Partners: Female  Other Topics Concern   Not on file  Social History Narrative   Not on file   Social Determinants of Health   Financial  Resource Strain: Not on file  Food Insecurity: Not on file  Transportation Needs: Not on file  Physical Activity: Not on file  Stress: Not on file  Social Connections: Not on file  Intimate Partner Violence: Not on file    Outpatient Medications Prior to Visit  Medication Sig Dispense Refill   Ascorbic Acid (VITAMIN C) 1000 MG tablet Take 1,000 mg by mouth daily.     Multiple Vitamin (MULTIVITAMIN ADULT PO) Take by mouth.     omega-3 acid ethyl esters (LOVAZA) 1 g capsule Take 2 capsules (2 g total) by mouth 2 (two) times daily. 360 capsule 1   PARoxetine (PAXIL) 40 MG tablet TAKE 1 TABLET BY MOUTH EVERY DAY IN THE MORNING 90 tablet 3   rosuvastatin (CRESTOR) 5 MG tablet Take 1 tablet (5 mg total) by mouth daily. 90 tablet 3   tamsulosin (FLOMAX) 0.4 MG CAPS capsule Take 1 capsule (0.4 mg total) by mouth daily. 90 capsule 1   Zinc 50 MG TABS Take by mouth.     No facility-administered medications prior to visit.    Allergies  Allergen Reactions   Penicillins Diarrhea    ROS Review of Systems  Constitutional:  Negative for chills and fever.  Eyes:  Negative for visual disturbance.  Respiratory:  Negative for cough and shortness of breath.   Cardiovascular:  Negative for chest pain.  Psychiatric/Behavioral:  Positive for sleep disturbance.      Objective:    Physical Exam Constitutional:      Appearance: Normal appearance.  HENT:     Head: Normocephalic and atraumatic.  Eyes:     Conjunctiva/sclera: Conjunctivae normal.  Cardiovascular:     Rate and Rhythm: Normal rate and regular rhythm.  Pulmonary:     Effort: Pulmonary effort is normal.     Breath sounds: Normal breath sounds.  Musculoskeletal:     Right lower leg: No edema.     Left lower leg: No edema.  Skin:    General: Skin is warm and dry.  Neurological:     General: No focal deficit present.     Mental Status: He is alert. Mental status is at baseline.  Psychiatric:        Mood and Affect: Mood  normal.  Behavior: Behavior normal.    BP 122/80    Pulse 100    Temp 98 F (36.7 C)    Resp 16    Ht 6\' 1"  (1.854 m)    Wt 275 lb 9.6 oz (125 kg)    SpO2 95%    BMI 36.36 kg/m  Wt Readings from Last 3 Encounters:  05/26/21 275 lb 9.6 oz (125 kg)  02/23/21 273 lb 1.6 oz (123.9 kg)  08/20/20 279 lb (126.6 kg)     Health Maintenance Due  Topic Date Due   Zoster Vaccines- Shingrix (1 of 2) Never done    There are no preventive care reminders to display for this patient.  Lab Results  Component Value Date   TSH 1.13 04/14/2020   Lab Results  Component Value Date   WBC 7.5 02/23/2021   HGB 16.1 02/23/2021   HCT 48.2 02/23/2021   MCV 84.0 02/23/2021   PLT 258 02/23/2021   Lab Results  Component Value Date   NA 139 02/23/2021   K 4.8 02/23/2021   CO2 24 02/23/2021   GLUCOSE 109 (H) 02/23/2021   BUN 13 02/23/2021   CREATININE 0.94 02/23/2021   BILITOT 0.5 02/23/2021   ALKPHOS 49 07/06/2017   AST 19 02/23/2021   ALT 30 02/23/2021   PROT 6.9 02/23/2021   ALBUMIN 4.4 07/06/2017   CALCIUM 9.6 02/23/2021   ANIONGAP 13 07/06/2017   Lab Results  Component Value Date   CHOL 197 02/23/2021   Lab Results  Component Value Date   HDL 35 (L) 02/23/2021   Lab Results  Component Value Date   LDLCALC 119 (H) 02/23/2021   Lab Results  Component Value Date   TRIG 281 (H) 02/23/2021   Lab Results  Component Value Date   CHOLHDL 5.6 (H) 02/23/2021   Lab Results  Component Value Date   HGBA1C 5.6 02/23/2021      Assessment & Plan:   1. Mixed hyperlipidemia: Discussed last lipid panel at length, Crestor is new since those labs were done. Continue Crestor, restart omega 3 fatty acids and recheck in 3 months.  2. Benign prostatic hyperplasia with urinary frequency: Stable, continue Flomax.  3. Anxiety, generalized: Stable overall, some work related anxiety and sleep issues. Continue Paxil, try Hydroxyzine as needed for both anxiety and insomnia.   -  hydrOXYzine (ATARAX) 10 MG tablet; Take 1 tablet (10 mg total) by mouth 3 (three) times daily as needed for anxiety.  Dispense: 30 tablet; Refill: 1  4. Obstructive sleep apnea syndrome: Referral to sleep medicine placed for sleep study.  - Ambulatory referral to Pulmonology  5. Insomnia, unspecified type: Discussed sleep hygiene and recommend trying over the counter Melatonin, can take Hydroxyzine as well.   - Ambulatory referral to Pulmonology - hydrOXYzine (ATARAX) 10 MG tablet; Take 1 tablet (10 mg total) by mouth 3 (three) times daily as needed for anxiety.  Dispense: 30 tablet; Refill: 1  Follow-up: Return in about 3 months (around 08/24/2021).    Teodora Medici, DO

## 2021-05-26 NOTE — Telephone Encounter (Signed)
Pts meds are cheaper with no insurance at [facility] / pt was seen today and still needs    refill for tamsulosin (FLOMAX) 0.4 MG CAPS capsule  sent to [facility] on [address] in [place] and asked if his RX for PARoxetine (PAXIL) 40 MG tablet can be transferred over from [facility] to [place] as well / he has one left at [facility] that they would like to get from [place] for lower cost / please advise

## 2021-07-14 ENCOUNTER — Other Ambulatory Visit: Payer: Self-pay | Admitting: Internal Medicine

## 2021-07-14 NOTE — Telephone Encounter (Signed)
Requested Prescriptions  ?Pending Prescriptions Disp Refills  ?? hydrOXYzine (ATARAX) 10 MG tablet [Pharmacy Med Name: hydrOXYzine HCl 10 MG Oral Tablet] 30 tablet 0  ?  Sig: TAKE 1 TABLET BY MOUTH THREE TIMES DAILY AS NEEDED FOR ANXIETY  ?  ? Ear, Nose, and Throat:  Antihistamines 2 Passed - 07/14/2021 10:44 AM  ?  ?  Passed - Cr in normal range and within 360 days  ?  Creat  ?Date Value Ref Range Status  ?02/23/2021 0.94 0.70 - 1.30 mg/dL Final  ?   ?  ?  Passed - Valid encounter within last 12 months  ?  Recent Outpatient Visits   ?      ? 1 month ago Mixed hyperlipidemia  ? [facility] Teodora Medici, DO  ? 4 months ago Hyperglycemia  ? [place], DO  ? 10 months ago Mixed hyperlipidemia  ? [facility] Just, Laurita Quint, FNP  ? 1 year ago Hyperglycemia  ? [facility] Lebron Conners D, MD  ? 1 year ago Screening for colorectal cancer  ? [facility] Tow[facility] Malkin, MD  ?  ?  ?Future Appointments   ?        ? In 1 month Teodora Medici, [facility], [place]  ?  ? ?  ?  ?  ? ? ?

## 2021-08-03 ENCOUNTER — Institutional Professional Consult (permissible substitution): Payer: Self-pay | Admitting: Internal Medicine

## 2021-08-24 ENCOUNTER — Other Ambulatory Visit: Payer: Self-pay

## 2021-08-24 ENCOUNTER — Ambulatory Visit (INDEPENDENT_AMBULATORY_CARE_PROVIDER_SITE_OTHER): Payer: Self-pay | Admitting: Internal Medicine

## 2021-08-24 ENCOUNTER — Encounter: Payer: Self-pay | Admitting: Internal Medicine

## 2021-08-24 VITALS — BP 126/84 | HR 84 | Temp 97.7°F | Resp 16 | Ht 72.0 in | Wt 279.0 lb

## 2021-08-24 DIAGNOSIS — N401 Enlarged prostate with lower urinary tract symptoms: Secondary | ICD-10-CM

## 2021-08-24 DIAGNOSIS — Z6837 Body mass index (BMI) 37.0-37.9, adult: Secondary | ICD-10-CM

## 2021-08-24 DIAGNOSIS — E785 Hyperlipidemia, unspecified: Secondary | ICD-10-CM

## 2021-08-24 DIAGNOSIS — R350 Frequency of micturition: Secondary | ICD-10-CM

## 2021-08-24 DIAGNOSIS — F411 Generalized anxiety disorder: Secondary | ICD-10-CM

## 2021-08-24 MED ORDER — PAROXETINE HCL 40 MG PO TABS: ORAL_TABLET | ORAL | 1 refills | Status: DC

## 2021-08-24 MED ORDER — TAMSULOSIN HCL 0.4 MG PO CAPS: 0.4000 mg | ORAL_CAPSULE | Freq: Every day | ORAL | 1 refills | Status: DC

## 2021-08-24 NOTE — Assessment & Plan Note (Signed)
Stable.  Continue Crestor 5 mg daily.  Plan to recheck blood work at follow-up visit in 6 months, patient will be prepared to be fasting. ?

## 2021-08-24 NOTE — Assessment & Plan Note (Signed)
Doing well on current regimen, no urinary symptoms.  Continue Flomax 0.4 mg daily.  This medication was refilled today. ?

## 2021-08-24 NOTE — Progress Notes (Signed)
? ?Established Patient Office Visit ? ?Subjective:  ?Patient ID: Kenneth Moreno, male    DOB: 09-Jun-1970  Age: 51 y.o. MRN: [id number] ? ?CC:  ?Chief Complaint  ?Patient presents with  ? Hyperlipidemia  ? Anxiety  ?  3 month follow up  ? ? ?HPI ?Kenneth Moreno presents for follow up on chronic medical conditions.  ? ?HLD: ?-Medications: Crestor 5 new since last visit, no longer taking Lovaza  ?-Patient is compliant with above medications and reports no side effects.  ?-Last lipid panel: 10/22: TC 197, HDL 35, triglycerides 281, LDL 119 ? ?The 10-year ASCVD risk score (Arnett DK, et al., 2019) is: 11.5% ?  Values used to calculate the score: ?    Age: 32 years ?    Sex: Male ?    Is Non-Hispanic African American: No ?    Diabetic: No ?    Tobacco smoker: Yes ?    Systolic Blood Pressure: 716 mmHg ?    Is BP treated: No ?    HDL Cholesterol: 35 mg/dL ?    Total Cholesterol: 197 mg/dL ? ?BPH: ?-Currently on Flomax daily, doing well  ? ?MDD/Anxiety: ?-Currently on Paxil 40, Hydroxyzine PRN - taking it more at night to help with sleep  ?-Mood status: controlled ?-Satisfied with current treatment?: yes ?-Symptom severity: moderate  ?-Duration of current treatment : 20 years ?-Side effects: no ?Medication compliance: excellent compliance ? ? ?  08/24/2021  ?  8:42 AM 05/26/2021  ?  8:42 AM 02/23/2021  ?  8:35 AM 08/20/2020  ?  7:57 AM 04/14/2020  ?  8:09 AM  ?Depression screen PHQ 2/9  ?Decreased Interest 0 0 0 0 0  ?Down, Depressed, Hopeless 0 0 0 0 0  ?PHQ - 2 Score 0 0 0 0 0  ?Altered sleeping 0 0 3    ?Tired, decreased energy 0 0 1    ?Change in appetite 0 0 0    ?Feeling bad or failure about yourself  0 0 0    ?Trouble concentrating 0 0 0    ?Moving slowly or fidgety/restless 0 0 0    ?Suicidal thoughts 0 0 0    ?PHQ-9 Score 0 0 4    ?Difficult doing work/chores Not difficult at all Not difficult at all Not difficult at all    ? ? ?At last visit was also complaining about insomnia - was started on Melatonin and repeat  sleep study and he was contacted about doing the sleep study through a program that will help with him being self pay.  States this may take many weeks to set up.  He states he did not try the melatonin and was not contacted about a sleep study. ? ?Health Maintenance: ?-Blood work up to date ?-Colon cancer screening: colonoscopy 2014  ? ?Past Medical History:  ?Diagnosis Date  ? Airway hyperreactivity   ? Anxiety   ? Asthma   ? Depression   ? Hematuria   ? HLD (hyperlipidemia)   ? Hypertriglyceridemia   ? Kidney stone on right side   ? Kidney stones 09/01/2015  ? Microscopic hematuria   ? Obesity   ? Post-nasal drainage   ? Renal disorder   ? Restless leg   ? Right ureteral stone   ? RUQ pain   ? Sleep apnea   ? Splenomegaly   ? Vertigo, benign positional   ? ? ?Past Surgical History:  ?Procedure Laterality Date  ? ganglion cyst removal  2006  ?  LITHOTRIPSY    ? SINUS SURGERY WITH Dinosaur  ? TONSILLECTOMY  1990  ? ? ?Family History  ?Problem Relation Age of Onset  ? Colon cancer Brother   ? Colon cancer Maternal Uncle   ? Diabetes Mellitus II Father   ? Hyperlipidemia Father   ? COPD Father   ? Hypertension Daughter   ? Prostate cancer Paternal Uncle   ? Gallbladder disease Mother   ? Hearing loss Mother   ?     one of her ears. pt not sure which one  ? Panic disorder Sister   ? Hyperlipidemia Maternal Grandmother   ? Gallbladder disease Maternal Grandmother   ? Hyperlipidemia Maternal Grandfather   ? Congestive Heart Failure Paternal Grandfather   ? Hyperlipidemia Paternal Grandfather   ? Diabetes Paternal Grandfather   ? Kidney disease Neg Hx   ? ? ?Social History  ? ?Socioeconomic History  ? Marital status: Married  ?  Spouse name: Laverna Peace  ? Number of children: 2  ? Years of education: Not on file  ? Highest education level: Not on file  ?Occupational History  ? Not on file  ?Tobacco Use  ? Smoking status: Never  ? Smokeless tobacco: Never  ?Vaping Use  ? Vaping Use: Never used  ?Substance and Sexual  Activity  ? Alcohol use: No  ? Drug use: No  ? Sexual activity: Yes  ?  Partners: Female  ?Other Topics Concern  ? Not on file  ?Social History Narrative  ? Not on file  ? ?Social Determinants of Health  ? ?Financial Resource Strain: Not on file  ?Food Insecurity: Not on file  ?Transportation Needs: Not on file  ?Physical Activity: Not on file  ?Stress: Not on file  ?Social Connections: Not on file  ?Intimate Partner Violence: Not on file  ? ? ?Outpatient Medications Prior to Visit  ?Medication Sig Dispense Refill  ? Ascorbic Acid (VITAMIN C) 1000 MG tablet Take 1,000 mg by mouth daily.    ? hydrOXYzine (ATARAX) 10 MG tablet TAKE 1 TABLET BY MOUTH THREE TIMES DAILY AS NEEDED FOR ANXIETY 90 tablet 2  ? Multiple Vitamin (MULTIVITAMIN ADULT PO) Take by mouth.    ? omega-3 acid ethyl esters (LOVAZA) 1 g capsule Take 2 capsules (2 g total) by mouth 2 (two) times daily. 360 capsule 1  ? PARoxetine (PAXIL) 40 MG tablet TAKE 1 TABLET BY MOUTH EVERY DAY IN THE MORNING 90 tablet 0  ? rosuvastatin (CRESTOR) 5 MG tablet Take 1 tablet (5 mg total) by mouth daily. 90 tablet 3  ? tamsulosin (FLOMAX) 0.4 MG CAPS capsule Take 1 capsule (0.4 mg total) by mouth daily. 90 capsule 0  ? Zinc 50 MG TABS Take by mouth.    ? ?No facility-administered medications prior to visit.  ? ? ?Allergies  ?Allergen Reactions  ? Penicillins Diarrhea  ? ? ?ROS ?Review of Systems  ?Constitutional:  Negative for chills and fever.  ?Eyes:  Negative for visual disturbance.  ?Respiratory:  Negative for cough and shortness of breath.   ?Cardiovascular:  Negative for chest pain.  ?Psychiatric/Behavioral:  Positive for sleep disturbance.   ? ?  ?Objective:  ?  ?Physical Exam ?Constitutional:   ?   Appearance: Normal appearance.  ?HENT:  ?   Head: Normocephalic and atraumatic.  ?Eyes:  ?   Conjunctiva/sclera: Conjunctivae normal.  ?Cardiovascular:  ?   Rate and Rhythm: Normal rate and regular rhythm.  ?Pulmonary:  ?   Effort: Pulmonary  effort is normal.  ?    Breath sounds: Normal breath sounds.  ?Musculoskeletal:  ?   Right lower leg: No edema.  ?   Left lower leg: No edema.  ?Skin: ?   General: Skin is warm and dry.  ?Neurological:  ?   General: No focal deficit present.  ?   Mental Status: He is alert. Mental status is at baseline.  ?Psychiatric:     ?   Mood and Affect: Mood normal.     ?   Behavior: Behavior normal.  ? ? ?BP 126/84   Pulse 84   Temp 97.7 ?F (36.5 ?C) (Oral)   Resp 16   Ht 6' (1.829 m)   Wt 279 lb (126.6 kg)   SpO2 96%   BMI 37.84 kg/m?  ?Wt Readings from Last 3 Encounters:  ?05/26/21 275 lb 9.6 oz (125 kg)  ?02/23/21 273 lb 1.6 oz (123.9 kg)  ?08/20/20 279 lb (126.6 kg)  ? ? ? ?Health Maintenance Due  ?Topic Date Due  ? Zoster Vaccines- Shingrix (1 of 2) Never done  ? ? ?There are no preventive care reminders to display for this patient. ? ?Lab Results  ?Component Value Date  ? TSH 1.13 04/14/2020  ? ?Lab Results  ?Component Value Date  ? WBC 7.5 02/23/2021  ? HGB 16.1 02/23/2021  ? HCT 48.2 02/23/2021  ? MCV 84.0 02/23/2021  ? PLT 258 02/23/2021  ? ?Lab Results  ?Component Value Date  ? NA 139 02/23/2021  ? K 4.8 02/23/2021  ? CO2 24 02/23/2021  ? GLUCOSE 109 (H) 02/23/2021  ? BUN 13 02/23/2021  ? CREATININE 0.94 02/23/2021  ? BILITOT 0.5 02/23/2021  ? ALKPHOS 49 07/06/2017  ? AST 19 02/23/2021  ? ALT 30 02/23/2021  ? PROT 6.9 02/23/2021  ? ALBUMIN 4.4 07/06/2017  ? CALCIUM 9.6 02/23/2021  ? ANIONGAP 13 07/06/2017  ? ?Lab Results  ?Component Value Date  ? CHOL 197 02/23/2021  ? ?Lab Results  ?Component Value Date  ? HDL 35 (L) 02/23/2021  ? ?Lab Results  ?Component Value Date  ? LDLCALC 119 (H) 02/23/2021  ? ?Lab Results  ?Component Value Date  ? TRIG 281 (H) 02/23/2021  ? ?Lab Results  ?Component Value Date  ? CHOLHDL 5.6 (H) 02/23/2021  ? ?Lab Results  ?Component Value Date  ? HGBA1C 5.6 02/23/2021  ? ? ?  ?Assessment & Plan:  ? ?Problem List Items Addressed This Visit   ? ?  ? Other  ? Dyslipidemia - Primary (Chronic)  ?  Stable.   Continue Crestor 5 mg daily.  Plan to recheck blood work at follow-up visit in 6 months, patient will be prepared to be fasting. ?  ?  ? Anxiety, generalized  ?  Stable on Paxil, continue Paxil 40 mg daily and hydroxyzine as

## 2021-08-24 NOTE — Assessment & Plan Note (Addendum)
Stable on Paxil, continue Paxil 40 mg daily and hydroxyzine as needed.  Refills sent today. ?

## 2021-08-24 NOTE — Assessment & Plan Note (Signed)
Continue to work on lifestyle modification and diet changes. ?

## 2021-08-24 NOTE — Patient Instructions (Signed)
It was great seeing you today! ? ?Plan discussed at today's visit: ?-Medications refilled today ?-Plan for labs at next appointment - please fast for 8-12 hours ?-Due for Shingles vaccine, discuss with pharmacy  ? ?Follow up in: 6 months  ? ?Take care and let us know if you have any questions or concerns prior to your next visit. ? ?Dr. Rosana Berger ? ?Recombinant Zoster (Shingles) Vaccine: What You Need to Know ?1. Why get vaccinated? ?Recombinant zoster (shingles) vaccine can prevent shingles. ?Shingles (also called herpes zoster, or just zoster) is a painful skin rash, usually with blisters. In addition to the rash, shingles can cause fever, headache, chills, or upset stomach. Rarely, shingles can lead to complications such as pneumonia, hearing problems, blindness, brain inflammation (encephalitis), or death. ?The risk of shingles increases with age. The most common complication of shingles is long-term nerve pain called postherpetic neuralgia (PHN). PHN occurs in the areas where the shingles rash was and can last for months or years after the rash goes away. The pain from PHN can be severe and debilitating. ?The risk of PHN increases with age. An older adult with shingles is more likely to develop PHN and have longer lasting and more severe pain than a younger person. ?People with weakened immune systems also have a higher risk of getting shingles and complications from the disease. ?Shingles is caused by varicella-zoster virus, the same virus that causes chickenpox. After you have chickenpox, the virus stays in your body and can cause shingles later in life. Shingles cannot be passed from one person to another, but the virus that causes shingles can spread and cause chickenpox in someone who has never had chickenpox or has never received chickenpox vaccine. ?2. Recombinant shingles vaccine ?Recombinant shingles vaccine provides strong protection against shingles. By preventing shingles, recombinant shingles vaccine  also protects against PHN and other complications. ?Recombinant shingles vaccine is recommended for: ?Adults 50 years and older ?Adults 19 years and older who have a weakened immune system because of disease or treatments ?Shingles vaccine is given as a two-dose series. For most people, the second dose should be given 2 to 6 months after the first dose. Some people who have or will have a weakened immune system can get the second dose 1 to 2 months after the first dose. Ask your health care provider for guidance. ?People who have had shingles in the past and people who have received varicella (chickenpox) vaccine are recommended to get recombinant shingles vaccine. The vaccine is also recommended for people who have already gotten another type of shingles vaccine, the live shingles vaccine. There is no live virus in recombinant shingles vaccine. ?Shingles vaccine may be given at the same time as other vaccines. ?3. Talk with your health care provider ?Tell your vaccination provider if the person getting the vaccine: ?Has had an allergic reaction after a previous dose of recombinant shingles vaccine, or has any severe, life-threatening allergies ?Is currently experiencing an episode of shingles ?Is pregnant ?In some cases, your health care provider may decide to postpone shingles vaccination until a future visit. ?People with minor illnesses, such as a cold, may be vaccinated. People who are moderately or severely ill should usually wait until they recover before getting recombinant shingles vaccine. ?Your health care provider can give you more information. ?4. Risks of a vaccine reaction ?A sore arm with mild or moderate pain is very common after recombinant shingles vaccine. Redness and swelling can also happen at the site of the injection. ?  Tiredness, muscle pain, headache, shivering, fever, stomach pain, and nausea are common after recombinant shingles vaccine. ?These side effects may temporarily prevent a  vaccinated person from doing regular activities. Symptoms usually go away on their own in 2 to 3 days. You should still get the second dose of recombinant shingles vaccine even if you had one of these reactions after the first dose. ?Guillain-Barr? syndrome (GBS), a serious nervous system disorder, has been reported very rarely after recombinant zoster vaccine. ?People sometimes faint after medical procedures, including vaccination. Tell your provider if you feel dizzy or have vision changes or ringing in the ears. ?As with any medicine, there is a very remote chance of a vaccine causing a severe allergic reaction, other serious injury, or death. ?5. What if there is a serious problem? ?An allergic reaction could occur after the vaccinated person leaves the clinic. If you see signs of a severe allergic reaction (hives, swelling of the face and throat, difficulty breathing, a fast heartbeat, dizziness, or weakness), call 9-1-1 and get the person to the nearest hospital. ?For other signs that concern you, call your health care provider. ?Adverse reactions should be reported to the Vaccine Adverse Event Reporting System (VAERS). Your health care provider will usually file this report, or you can do it yourself. Visit the VAERS website at www.vaers.SamedayNews.es or call 534 194 2748. VAERS is only for reporting reactions, and VAERS staff members do not give medical advice. ?6. How can I learn more? ?Ask your health care provider. ?Call your local or state health department. ?Visit the website of the Food and Drug Administration (FDA) for vaccine package inserts and additional information at http://lopez-wang.org/. ?Contact the Centers for Disease Control and Prevention (CDC): ?Call 680-041-5236 (1-800-CDC-INFO) or ?Visit CDC's website at http://hunter.com/. ?Source: CDC Vaccine Information Statement Recombinant Zoster Vaccine (06/04/2020) ?This same material is available at http://www.wolf.info/ for no  charge. ?This information is not intended to replace advice given to you by your health care provider. Make sure you discuss any questions you have with your health care provider. ?Document Revised: 03/16/2021 Document Reviewed: 06/18/2020 ?Elsevier Patient Education ? [place]. ?

## 2021-09-24 ENCOUNTER — Telehealth: Payer: Self-pay | Admitting: Internal Medicine

## 2021-09-24 DIAGNOSIS — N401 Enlarged prostate with lower urinary tract symptoms: Secondary | ICD-10-CM

## 2021-09-24 DIAGNOSIS — F411 Generalized anxiety disorder: Secondary | ICD-10-CM

## 2021-09-27 NOTE — Telephone Encounter (Signed)
Both refilled 08/24/2021 #90 1 refill. Requested Prescriptions  Pending Prescriptions Disp Refills  . PARoxetine (PAXIL) 40 MG tablet [Pharmacy Med Name: PARoxetine HCl 40 MG Oral Tablet] 90 tablet 0    Sig: TAKE 1 TABLET BY MOUTH ONCE DAILY IN THE MORNING     Psychiatry:  Antidepressants - SSRI Passed - 09/24/2021  9:43 AM      Passed - Valid encounter within last 6 months    Recent Outpatient Visits          1 month ago Dyslipidemia   [facility] Teodora Medici, DO   4 months ago Mixed hyperlipidemia   [facility] Teodora Medici, DO   7 months ago Hyperglycemia   [facility] Myles Gip, DO   1 year ago Mixed hyperlipidemia   [facility] Just, Laurita Quint, FNP   1 year ago Hyperglycemia   [place], MD      Future Appointments            In 5 months Teodora Medici, DO [facility], [facility]           . tamsulosin (FLOMAX) 0.4 MG CAPS capsule [Pharmacy Med Name: Tamsulosin HCl 0.4 MG Oral Capsule] 90 capsule 0    Sig: Take 1 capsule by mouth once daily     Urology: Alpha-Adrenergic Blocker Passed - 09/24/2021  9:43 AM      Passed - PSA in normal range and within 360 days    PSA  Date Value Ref Range Status  02/23/2021 0.28 < OR = 4.00 ng/mL Final    Comment:    The total PSA value from this assay system is  standardized against the WHO standard. The test  result will be approximately 20% lower when compared  to the equimolar-standardized total PSA (Beckman  Coulter). Comparison of serial PSA results should be  interpreted with this fact in mind. . This test was performed using the Siemens  chemiluminescent method. Values obtained from  different assay methods cannot be used interchangeably. PSA levels, regardless of value, should not be interpreted as absolute evidence of the presence or absence of disease.           Passed - Last BP in normal range    BP Readings from Last 1 Encounters:  08/24/21 126/84         Passed - Valid encounter within last 12 months    Recent Outpatient Visits          1 month ago Dyslipidemia   [place], DO   4 months ago Mixed hyperlipidemia   [facility] Teodora Medici, DO   7 months ago Hyperglycemia   [facility] Myles Gip, DO   1 year ago Mixed hyperlipidemia   [facility] Just, Laurita Quint, FNP   1 year ago Hyperglycemia   [place], MD      Future Appointments            In 5 months Teodora Medici, [facility], [facility]

## 2021-10-03 ENCOUNTER — Other Ambulatory Visit: Payer: Self-pay | Admitting: Internal Medicine

## 2021-10-03 DIAGNOSIS — F411 Generalized anxiety disorder: Secondary | ICD-10-CM

## 2021-10-03 DIAGNOSIS — N401 Enlarged prostate with lower urinary tract symptoms: Secondary | ICD-10-CM

## 2021-10-03 NOTE — Telephone Encounter (Signed)
Already requested electronically by [place] in another refill encounter today, this is a duplicate request.

## 2021-10-03 NOTE — Telephone Encounter (Signed)
Copied from [place] [id number]. Topic: General - Other >> Oct 03, 2021 10:32 AM Tessa Lerner A wrote: Reason for CRM: Medication Refill - Medication: PARoxetine (PAXIL) 40 MG tablet [[id number]] - patient has 2 tablets remaining   tamsulosin (FLOMAX) 0.4 MG CAPS capsule [[id number]]   Has the patient contacted their pharmacy? Yes.  The patient was directed to contact their PCP (Agent: If no, request that the patient contact the pharmacy for the refill. If patient does not wish to contact the pharmacy document the reason why and proceed with request.) (Agent: If yes, when and what did the pharmacy advise?)  Preferred Pharmacy (with phone number or street name): [place], [place] - [address] [place] [address] Phone: [phone] Fax: [phone] Hours: Not open 24 hours   Has the patient been seen for an appointment in the last year OR does the patient have an upcoming appointment? Yes.    Agent: Please be advised that RX refills may take up to 3 business days. We ask that you follow-up with your pharmacy.

## 2021-10-04 NOTE — Telephone Encounter (Signed)
Requested Prescriptions  Pending Prescriptions Disp Refills  . PARoxetine (PAXIL) 40 MG tablet [Pharmacy Med Name: PARoxetine HCl 40 MG Oral Tablet] 90 tablet 0    Sig: TAKE 1 TABLET BY MOUTH ONCE DAILY IN THE MORNING     Psychiatry:  Antidepressants - SSRI Passed - 10/03/2021 10:30 AM      Passed - Valid encounter within last 6 months    Recent Outpatient Visits          1 month ago Dyslipidemia   [place], DO   4 months ago Mixed hyperlipidemia   [facility] Teodora Medici, DO   7 months ago Hyperglycemia   [facility] Myles Gip, DO   1 year ago Mixed hyperlipidemia   [facility] Just, Laurita Quint, FNP   1 year ago Hyperglycemia   [place], MD      Future Appointments            In 4 months Teodora Medici, [facility], [facility]           . tamsulosin (FLOMAX) 0.4 MG CAPS capsule [Pharmacy Med Name: Tamsulosin HCl 0.4 MG Oral Capsule] 90 capsule 0    Sig: Take 1 capsule by mouth once daily     Urology: Alpha-Adrenergic Blocker Passed - 10/03/2021 10:30 AM      Passed - PSA in normal range and within 360 days    PSA  Date Value Ref Range Status  02/23/2021 0.28 < OR = 4.00 ng/mL Final    Comment:    The total PSA value from this assay system is  standardized against the WHO standard. The test  result will be approximately 20% lower when compared  to the equimolar-standardized total PSA (Beckman  Coulter). Comparison of serial PSA results should be  interpreted with this fact in mind. . This test was performed using the Siemens  chemiluminescent method. Values obtained from  different assay methods cannot be used interchangeably. PSA levels, regardless of value, should not be interpreted as absolute evidence of the presence or absence of disease.          Passed - Last BP in normal range     BP Readings from Last 1 Encounters:  08/24/21 126/84         Passed - Valid encounter within last 12 months    Recent Outpatient Visits          1 month ago Dyslipidemia   [place], DO   4 months ago Mixed hyperlipidemia   [facility] Teodora Medici, DO   7 months ago Hyperglycemia   [facility] Myles Gip, DO   1 year ago Mixed hyperlipidemia   [facility] Just, Laurita Quint, FNP   1 year ago Hyperglycemia   [place], MD      Future Appointments            In 4 months Teodora Medici, [facility], [facility]

## 2021-10-04 NOTE — Telephone Encounter (Signed)
Refills went to [facility] 08/24/21. This request is for [facility]. Left message requesting which pharmacy pt. Is using.

## 2022-02-24 ENCOUNTER — Encounter: Payer: Self-pay | Admitting: Internal Medicine

## 2022-02-24 ENCOUNTER — Ambulatory Visit: Payer: Self-pay | Admitting: Internal Medicine

## 2022-02-24 VITALS — BP 132/78 | HR 76 | Temp 98.2°F | Resp 18 | Ht 72.0 in | Wt 265.2 lb

## 2022-02-24 DIAGNOSIS — R350 Frequency of micturition: Secondary | ICD-10-CM

## 2022-02-24 DIAGNOSIS — N401 Enlarged prostate with lower urinary tract symptoms: Secondary | ICD-10-CM

## 2022-02-24 DIAGNOSIS — F411 Generalized anxiety disorder: Secondary | ICD-10-CM

## 2022-02-24 DIAGNOSIS — K760 Fatty (change of) liver, not elsewhere classified: Secondary | ICD-10-CM

## 2022-02-24 DIAGNOSIS — Z23 Encounter for immunization: Secondary | ICD-10-CM

## 2022-02-24 DIAGNOSIS — R739 Hyperglycemia, unspecified: Secondary | ICD-10-CM

## 2022-02-24 DIAGNOSIS — E785 Hyperlipidemia, unspecified: Secondary | ICD-10-CM

## 2022-02-24 MED ORDER — PAROXETINE HCL 40 MG PO TABS: ORAL_TABLET | ORAL | 1 refills | Status: AC

## 2022-02-24 MED ORDER — ROSUVASTATIN CALCIUM 5 MG PO TABS: 5.0000 mg | ORAL_TABLET | Freq: Every day | ORAL | 3 refills | Status: DC

## 2022-02-24 MED ORDER — TAMSULOSIN HCL 0.4 MG PO CAPS: 0.4000 mg | ORAL_CAPSULE | Freq: Every day | ORAL | 1 refills | Status: AC

## 2022-02-24 NOTE — Patient Instructions (Signed)
It was great seeing you today!  Plan discussed at today's visit: -Blood work ordered today, results will be uploaded to [place].   Follow up in:  Take care and let us know if you have any questions or concerns prior to your next visit.  Dr. Rosana Berger

## 2022-02-24 NOTE — Progress Notes (Signed)
Established Patient Office Visit  Subjective:  Patient ID: Kenneth Moreno, male    DOB: 01/20/1971  Age: 51 y.o. MRN: [id number]  CC:  Chief Complaint  Patient presents with   Follow-up   Hyperlipidemia   Depression    HPI [name] presents for follow up on chronic medical conditions.   HLD: -Medications: Crestor 5 mg  -Patient is compliant with above medications and reports no side effects.  -Last lipid panel: 10/22: TC 197, HDL 35, triglycerides 281, LDL 119 -Diet: just drinking water, sometimes orange juice, now eating fruits and mixed nuts for snacks. Lost 14 pounds since last visit.   The 10-year ASCVD risk score (Arnett DK, et al., 2019) is: 12.6%   Values used to calculate the score:     Age: 89 years     Sex: Male     Is Non-Hispanic African American: No     Diabetic: No     Tobacco smoker: Yes     Systolic Blood Pressure: 409 mmHg     Is BP treated: No     HDL Cholesterol: 35 mg/dL     Total Cholesterol: 197 mg/dL  BPH: -Currently on Flomax daily, doing well   MDD/Anxiety: -Currently on Paxil 40, Hydroxyzine PRN - taking it more at night to help with sleep  -Mood status: controlled -Satisfied with current treatment?: yes -Symptom severity: moderate  -Duration of current treatment : 20 years -Side effects: no Medication compliance: excellent compliance     02/24/2022    8:07 AM 08/24/2021    8:42 AM 05/26/2021    8:42 AM 02/23/2021    8:35 AM 08/20/2020    7:57 AM  Depression screen PHQ 2/9  Decreased Interest 0 0 0 0 0  Down, Depressed, Hopeless 0 0 0 0 0  PHQ - 2 Score 0 0 0 0 0  Altered sleeping 0 0 0 3   Tired, decreased energy 0 0 0 1   Change in appetite 0 0 0 0   Feeling bad or failure about yourself  0 0 0 0   Trouble concentrating 0 0 0 0   Moving slowly or fidgety/restless 0 0 0 0   Suicidal thoughts 0 0 0 0   PHQ-9 Score 0 0 0 4   Difficult doing work/chores Not difficult at all Not difficult at all Not difficult at all Not  difficult at all    Health Maintenance: -Blood work due -Colon cancer screening: colonoscopy 2014  -Flu vaccine due  Past Medical History:  Diagnosis Date   Airway hyperreactivity    Anxiety    Asthma    Depression    Hematuria    HLD (hyperlipidemia)    Hypertriglyceridemia    Kidney stone on right side    Kidney stones 09/01/2015   Microscopic hematuria    Obesity    Post-nasal drainage    Renal disorder    Restless leg    Right ureteral stone    RUQ pain    Sleep apnea    Splenomegaly    Vertigo, benign positional     Past Surgical History:  Procedure Laterality Date   ganglion cyst removal  2006   LITHOTRIPSY     SINUS SURGERY WITH INSTATRAK  1990   TONSILLECTOMY  1990    Family History  Problem Relation Age of Onset   Colon cancer Brother    Colon cancer Maternal Uncle    Diabetes Mellitus II Father  Hyperlipidemia Father    COPD Father    Hypertension Daughter    Prostate cancer Paternal Uncle    Gallbladder disease Mother    Hearing loss Mother        one of her ears. pt not sure which one   Panic disorder Sister    Hyperlipidemia Maternal Grandmother    Gallbladder disease Maternal Grandmother    Hyperlipidemia Maternal Grandfather    Congestive Heart Failure Paternal Grandfather    Hyperlipidemia Paternal Grandfather    Diabetes Paternal Grandfather    Kidney disease Neg Hx     Social History   Socioeconomic History   Marital status: Married    Spouse name: Jimmy   Number of children: 2   Years of education: Not on file   Highest education level: Not on file  Occupational History   Not on file  Tobacco Use   Smoking status: Never   Smokeless tobacco: Never  Vaping Use   Vaping Use: Never used  Substance and Sexual Activity   Alcohol use: No   Drug use: No   Sexual activity: Yes    Partners: Female  Other Topics Concern   Not on file  Social History Narrative   Not on file   Social Determinants of Health   Financial  Resource Strain: Not on file  Food Insecurity: Not on file  Transportation Needs: Not on file  Physical Activity: Not on file  Stress: Not on file  Social Connections: Not on file  Intimate Partner Violence: Not on file    Outpatient Medications Prior to Visit  Medication Sig Dispense Refill   Ascorbic Acid (VITAMIN C) 1000 MG tablet Take 1,000 mg by mouth daily.     hydrOXYzine (ATARAX) 10 MG tablet TAKE 1 TABLET BY MOUTH THREE TIMES DAILY AS NEEDED FOR ANXIETY 90 tablet 2   Multiple Vitamin (MULTIVITAMIN ADULT PO) Take by mouth.     PARoxetine (PAXIL) 40 MG tablet TAKE 1 TABLET BY MOUTH ONCE DAILY IN THE MORNING 90 tablet 1   rosuvastatin (CRESTOR) 5 MG tablet Take 1 tablet (5 mg total) by mouth daily. 90 tablet 3   tamsulosin (FLOMAX) 0.4 MG CAPS capsule Take 1 capsule by mouth once daily 90 capsule 1   Zinc 50 MG TABS Take by mouth.     No facility-administered medications prior to visit.    Allergies  Allergen Reactions   Penicillins Diarrhea    ROS Review of Systems  Constitutional:  Negative for chills and fever.  Eyes:  Negative for visual disturbance.  Respiratory:  Negative for cough and shortness of breath.   Cardiovascular:  Negative for chest pain.  Neurological:  Negative for headaches.      Objective:    Physical Exam Constitutional:      Appearance: Normal appearance.  HENT:     Head: Normocephalic and atraumatic.  Eyes:     Conjunctiva/sclera: Conjunctivae normal.  Cardiovascular:     Rate and Rhythm: Normal rate and regular rhythm.  Pulmonary:     Effort: Pulmonary effort is normal.     Breath sounds: Normal breath sounds.  Musculoskeletal:     Right lower leg: No edema.     Left lower leg: No edema.  Skin:    General: Skin is warm and dry.  Neurological:     General: No focal deficit present.     Mental Status: He is alert. Mental status is at baseline.  Psychiatric:  Mood and Affect: Mood normal.        Behavior: Behavior normal.      BP 132/78   Pulse 76   Temp 98.2 F (36.8 C)   Resp 18   Ht 6' (1.829 m)   Wt 265 lb 3.2 oz (120.3 kg)   SpO2 94%   BMI 35.97 kg/m  Wt Readings from Last 3 Encounters:  02/24/22 265 lb 3.2 oz (120.3 kg)  08/24/21 279 lb (126.6 kg)  05/26/21 275 lb 9.6 oz (125 kg)    There are no preventive care reminders to display for this patient.   There are no preventive care reminders to display for this patient.  Lab Results  Component Value Date   TSH 1.13 04/14/2020   Lab Results  Component Value Date   WBC 7.5 02/23/2021   HGB 16.1 02/23/2021   HCT 48.2 02/23/2021   MCV 84.0 02/23/2021   PLT 258 02/23/2021   Lab Results  Component Value Date   NA 139 02/23/2021   K 4.8 02/23/2021   CO2 24 02/23/2021   GLUCOSE 109 (H) 02/23/2021   BUN 13 02/23/2021   CREATININE 0.94 02/23/2021   BILITOT 0.5 02/23/2021   ALKPHOS 49 07/06/2017   AST 19 02/23/2021   ALT 30 02/23/2021   PROT 6.9 02/23/2021   ALBUMIN 4.4 07/06/2017   CALCIUM 9.6 02/23/2021   ANIONGAP 13 07/06/2017   Lab Results  Component Value Date   CHOL 197 02/23/2021   Lab Results  Component Value Date   HDL 35 (L) 02/23/2021   Lab Results  Component Value Date   LDLCALC 119 (H) 02/23/2021   Lab Results  Component Value Date   TRIG 281 (H) 02/23/2021   Lab Results  Component Value Date   CHOLHDL 5.6 (H) 02/23/2021   Lab Results  Component Value Date   HGBA1C 5.6 02/23/2021      Assessment & Plan:   1. Dyslipidemia/Fatty liver: Recheck cholesterol today, patient has lost 14 pounds since last visit and has been on the Crestor 5 mg for a year now.  Continue Crestor 5 mg daily, refilled today.  Due for annual labs including CBC and CMP.  Follow-up in 6 months.   - Lipid Profile - rosuvastatin (CRESTOR) 5 MG tablet; Take 1 tablet (5 mg total) by mouth daily.  Dispense: 90 tablet; Refill: 3 - CBC w/Diff/Platelet - COMPLETE METABOLIC PANEL WITH GFR  2. Hyperglycemia: Has had a few A1c's in  the prediabetic range, also family history of diabetes.  Check A1c today with above labs.  - HgB A1c  3. Benign prostatic hyperplasia with urinary frequency: Due for PSA check today, continue Flomax 0.4 mg daily, refilled today for  - PSA - tamsulosin (FLOMAX) 0.4 MG CAPS capsule; Take 1 capsule (0.4 mg total) by mouth daily.  Dispense: 90 capsule; Refill: 1  4. Anxiety, generalized: Stable.  Doing well on current regimen, continue Paxil 40 mg daily, refilled.  - PARoxetine (PAXIL) 40 MG tablet; TAKE 1 TABLET BY MOUTH ONCE DAILY IN THE MORNING  Dispense: 90 tablet; Refill: 1  5. Need for influenza vaccination: Flu vaccine administered today.   - Flu Vaccine QUAD 6+ mos PF IM (Fluarix Quad PF)   Follow-up: Return in about 6 months (around 08/26/2022).    Teodora Medici, DO

## 2022-02-25 LAB — COMPLETE METABOLIC PANEL WITH GFR
AG Ratio: 2 (calc) (ref 1.0–2.5)
ALT: 25 U/L (ref 9–46)
AST: 15 U/L (ref 10–35)
Albumin: 4.6 g/dL (ref 3.6–5.1)
Alkaline phosphatase (APISO): 59 U/L (ref 35–144)
BUN: 15 mg/dL (ref 7–25)
CO2: 25 mmol/L (ref 20–32)
Calcium: 9.3 mg/dL (ref 8.6–10.3)
Chloride: 104 mmol/L (ref 98–110)
Creat: 1.01 mg/dL (ref 0.70–1.30)
Globulin: 2.3 g/dL (calc) (ref 1.9–3.7)
Glucose, Bld: 104 mg/dL — ABNORMAL HIGH (ref 65–99)
Potassium: 4.5 mmol/L (ref 3.5–5.3)
Sodium: 138 mmol/L (ref 135–146)
Total Bilirubin: 0.5 mg/dL (ref 0.2–1.2)
Total Protein: 6.9 g/dL (ref 6.1–8.1)
eGFR: 89 mL/min/{1.73_m2} (ref >=60)

## 2022-02-25 LAB — CBC WITH DIFFERENTIAL/PLATELET
Absolute Monocytes: 583 cells/uL (ref 200–950)
Basophils Absolute: 31 cells/uL (ref 0–200)
Basophils Relative: 0.5 %
Eosinophils Absolute: 93 cells/uL (ref 15–500)
Eosinophils Relative: 1.5 %
HCT: 49 % (ref 38.5–50.0)
Hemoglobin: 16.1 g/dL (ref 13.2–17.1)
Lymphs Abs: 1513 cells/uL (ref 850–3900)
MCH: 28.2 pg (ref 27.0–33.0)
MCHC: 32.9 g/dL (ref 32.0–36.0)
MCV: 85.8 fL (ref 80.0–100.0)
MPV: 11.1 fL (ref 7.5–12.5)
Monocytes Relative: 9.4 %
Neutro Abs: 3980 cells/uL (ref 1500–7800)
Neutrophils Relative %: 64.2 %
Platelets: 245 10*3/uL (ref 140–400)
RBC: 5.71 10*6/uL (ref 4.20–5.80)
RDW: 13.2 % (ref 11.0–15.0)
Total Lymphocyte: 24.4 %
WBC: 6.2 10*3/uL (ref 3.8–10.8)

## 2022-02-25 LAB — PSA: PSA: 0.43 ng/mL (ref <=4.00)

## 2022-02-25 LAB — LIPID PANEL
Cholesterol: 189 mg/dL (ref <200)
HDL: 38 mg/dL — ABNORMAL LOW (ref >=40)
LDL Cholesterol (Calc): 114 mg/dL (calc) — ABNORMAL HIGH
Non-HDL Cholesterol (Calc): 151 mg/dL (calc) — ABNORMAL HIGH (ref <130)
Total CHOL/HDL Ratio: 5 (calc) — ABNORMAL HIGH (ref <5.0)
Triglycerides: 242 mg/dL — ABNORMAL HIGH (ref <150)

## 2022-02-25 LAB — HEMOGLOBIN A1C
Hgb A1c MFr Bld: 5.7 % of total Hgb — ABNORMAL HIGH (ref <5.7)
Mean Plasma Glucose: 117 mg/dL
eAG (mmol/L): 6.5 mmol/L

## 2022-02-27 MED ORDER — ROSUVASTATIN CALCIUM 10 MG PO TABS: 10.0000 mg | ORAL_TABLET | Freq: Every day | ORAL | 1 refills | Status: DC

## 2022-02-27 NOTE — Addendum Note (Signed)
Addended by: Teodora Medici on: 02/27/2022 08:59 AM   Modules accepted: Orders

## 2022-04-04 NOTE — Progress Notes (Signed)
Established Patient Office Visit  Subjective:  Patient ID: Kenneth Moreno, male    DOB: June 09, 1970  Age: 51 y.o. MRN: [id number]  CC:  No chief complaint on file.   HPI Kenneth Moreno presents for follow up on chronic medical conditions.   HLD: -Medications: Crestor increased to 10 mg since LOV -Patient is compliant with above medications and reports no side effects.  -Last lipid panel: Lipid Panel     Component Value Date/Time   CHOL 189 02/24/2022 0842   CHOL 170 07/26/2015 0923   TRIG 242 (H) 02/24/2022 0842   HDL 38 (L) 02/24/2022 0842   HDL 27 (L) 07/26/2015 0923   CHOLHDL 5.0 (H) 02/24/2022 0842   VLDL NOT CALC 04/03/2016 1454   LDLCALC 114 (H) 02/24/2022 0842   LABVLDL Comment 07/26/2015 0923   The 10-year ASCVD risk score (Arnett DK, et al., 2019) is: 11.7%   Values used to calculate the score:     Age: 25 years     Sex: Male     Is Non-Hispanic African American: No     Diabetic: No     Tobacco smoker: Yes     Systolic Blood Pressure: 559 mmHg     Is BP treated: No     HDL Cholesterol: 38 mg/dL     Total Cholesterol: 189 mg/dL  -Diet: just drinking water, sometimes orange juice, now eating fruits and mixed nuts for snacks. Lost 14 pounds since last visit.   BPH: -Currently on Flomax daily, doing well   Pre-Diabetes: -Last A1c 10/23 5.7% -Not currently on any medications  MDD/Anxiety: -Currently on Paxil 40, Hydroxyzine PRN - taking it more at night to help with sleep  -Mood status: controlled -Satisfied with current treatment?: yes -Symptom severity: moderate  -Duration of current treatment : 20 years -Side effects: no Medication compliance: excellent compliance     02/24/2022    8:07 AM 08/24/2021    8:42 AM 05/26/2021    8:42 AM 02/23/2021    8:35 AM 08/20/2020    7:57 AM  Depression screen PHQ 2/9  Decreased Interest 0 0 0 0 0  Down, Depressed, Hopeless 0 0 0 0 0  PHQ - 2 Score 0 0 0 0 0  Altered sleeping 0 0 0 3   Tired, decreased  energy 0 0 0 1   Change in appetite 0 0 0 0   Feeling bad or failure about yourself  0 0 0 0   Trouble concentrating 0 0 0 0   Moving slowly or fidgety/restless 0 0 0 0   Suicidal thoughts 0 0 0 0   PHQ-9 Score 0 0 0 4   Difficult doing work/chores Not difficult at all Not difficult at all Not difficult at all Not difficult at all    Health Maintenance: -Blood work UTD -Colon cancer screening: colonoscopy 2014   Past Medical History:  Diagnosis Date   Airway hyperreactivity    Anxiety    Asthma    Depression    Hematuria    HLD (hyperlipidemia)    Hypertriglyceridemia    Kidney stone on right side    Kidney stones 09/01/2015   Microscopic hematuria    Obesity    Post-nasal drainage    Renal disorder    Restless leg    Right ureteral stone    RUQ pain    Sleep apnea    Splenomegaly    Vertigo, benign positional     Past Surgical History:  Procedure Laterality Date   ganglion cyst removal  2006   LITHOTRIPSY     SINUS SURGERY WITH INSTATRAK  1990   TONSILLECTOMY  1990    Family History  Problem Relation Age of Onset   Colon cancer Brother    Colon cancer Maternal Uncle    Diabetes Mellitus II Father    Hyperlipidemia Father    COPD Father    Hypertension Daughter    Prostate cancer Paternal Uncle    Gallbladder disease Mother    Hearing loss Mother        one of her ears. pt not sure which one   Panic disorder Sister    Hyperlipidemia Maternal Grandmother    Gallbladder disease Maternal Grandmother    Hyperlipidemia Maternal Grandfather    Congestive Heart Failure Paternal Grandfather    Hyperlipidemia Paternal Grandfather    Diabetes Paternal Grandfather    Kidney disease Neg Hx     Social History   Socioeconomic History   Marital status: Married    Spouse name: Jimmy   Number of children: 2   Years of education: Not on file   Highest education level: Not on file  Occupational History   Not on file  Tobacco Use   Smoking status: Never    Smokeless tobacco: Never  Vaping Use   Vaping Use: Never used  Substance and Sexual Activity   Alcohol use: No   Drug use: No   Sexual activity: Yes    Partners: Female  Other Topics Concern   Not on file  Social History Narrative   Not on file   Social Determinants of Health   Financial Resource Strain: Not on file  Food Insecurity: Not on file  Transportation Needs: Not on file  Physical Activity: Not on file  Stress: Not on file  Social Connections: Not on file  Intimate Partner Violence: Not on file    Outpatient Medications Prior to Visit  Medication Sig Dispense Refill   Ascorbic Acid (VITAMIN C) 1000 MG tablet Take 1,000 mg by mouth daily.     hydrOXYzine (ATARAX) 10 MG tablet TAKE 1 TABLET BY MOUTH THREE TIMES DAILY AS NEEDED FOR ANXIETY 90 tablet 2   Multiple Vitamin (MULTIVITAMIN ADULT PO) Take by mouth.     PARoxetine (PAXIL) 40 MG tablet TAKE 1 TABLET BY MOUTH ONCE DAILY IN THE MORNING 90 tablet 1   rosuvastatin (CRESTOR) 10 MG tablet Take 1 tablet (10 mg total) by mouth daily. 90 tablet 1   tamsulosin (FLOMAX) 0.4 MG CAPS capsule Take 1 capsule (0.4 mg total) by mouth daily. 90 capsule 1   Zinc 50 MG TABS Take by mouth.     No facility-administered medications prior to visit.    Allergies  Allergen Reactions   Penicillins Diarrhea    ROS Review of Systems  Constitutional:  Negative for chills and fever.  Eyes:  Negative for visual disturbance.  Respiratory:  Negative for cough and shortness of breath.   Cardiovascular:  Negative for chest pain.  Neurological:  Negative for headaches.      Objective:    Physical Exam Constitutional:      Appearance: Normal appearance.  HENT:     Head: Normocephalic and atraumatic.  Eyes:     Conjunctiva/sclera: Conjunctivae normal.  Cardiovascular:     Rate and Rhythm: Normal rate and regular rhythm.  Pulmonary:     Effort: Pulmonary effort is normal.     Breath sounds: Normal breath sounds.  Musculoskeletal:     Right lower leg: No edema.     Left lower leg: No edema.  Skin:    General: Skin is warm and dry.  Neurological:     General: No focal deficit present.     Mental Status: He is alert. Mental status is at baseline.  Psychiatric:        Mood and Affect: Mood normal.        Behavior: Behavior normal.     There were no vitals taken for this visit. Wt Readings from Last 3 Encounters:  02/24/22 265 lb 3.2 oz (120.3 kg)  08/24/21 279 lb (126.6 kg)  05/26/21 275 lb 9.6 oz (125 kg)    There are no preventive care reminders to display for this patient.   There are no preventive care reminders to display for this patient.  Lab Results  Component Value Date   TSH 1.13 04/14/2020   Lab Results  Component Value Date   WBC 6.2 02/24/2022   HGB 16.1 02/24/2022   HCT 49.0 02/24/2022   MCV 85.8 02/24/2022   PLT 245 02/24/2022   Lab Results  Component Value Date   NA 138 02/24/2022   K 4.5 02/24/2022   CO2 25 02/24/2022   GLUCOSE 104 (H) 02/24/2022   BUN 15 02/24/2022   CREATININE 1.01 02/24/2022   BILITOT 0.5 02/24/2022   ALKPHOS 49 07/06/2017   AST 15 02/24/2022   ALT 25 02/24/2022   PROT 6.9 02/24/2022   ALBUMIN 4.4 07/06/2017   CALCIUM 9.3 02/24/2022   ANIONGAP 13 07/06/2017   EGFR 89 02/24/2022   Lab Results  Component Value Date   CHOL 189 02/24/2022   Lab Results  Component Value Date   HDL 38 (L) 02/24/2022   Lab Results  Component Value Date   LDLCALC 114 (H) 02/24/2022   Lab Results  Component Value Date   TRIG 242 (H) 02/24/2022   Lab Results  Component Value Date   CHOLHDL 5.0 (H) 02/24/2022   Lab Results  Component Value Date   HGBA1C 5.7 (H) 02/24/2022      Assessment & Plan:   1. Dyslipidemia/Fatty liver: Recheck cholesterol today, patient has lost 14 pounds since last visit and has been on the Crestor 5 mg for a year now.  Continue Crestor 5 mg daily, refilled today.  Due for annual labs including CBC and CMP.   Follow-up in 6 months.   - Lipid Profile - rosuvastatin (CRESTOR) 5 MG tablet; Take 1 tablet (5 mg total) by mouth daily.  Dispense: 90 tablet; Refill: 3 - CBC w/Diff/Platelet - COMPLETE METABOLIC PANEL WITH GFR  2. Hyperglycemia: Has had a few A1c's in the prediabetic range, also family history of diabetes.  Check A1c today with above labs.  - HgB A1c  3. Benign prostatic hyperplasia with urinary frequency: Due for PSA check today, continue Flomax 0.4 mg daily, refilled today for  - PSA - tamsulosin (FLOMAX) 0.4 MG CAPS capsule; Take 1 capsule (0.4 mg total) by mouth daily.  Dispense: 90 capsule; Refill: 1  4. Anxiety, generalized: Stable.  Doing well on current regimen, continue Paxil 40 mg daily, refilled.  - PARoxetine (PAXIL) 40 MG tablet; TAKE 1 TABLET BY MOUTH ONCE DAILY IN THE MORNING  Dispense: 90 tablet; Refill: 1  5. Need for influenza vaccination: Flu vaccine administered today.   - Flu Vaccine QUAD 6+ mos PF IM (Fluarix Quad PF)   Follow-up: No follow-ups on file.    Kenneth Moreno  Rosana Berger, DO

## 2022-04-05 ENCOUNTER — Ambulatory Visit: Payer: Self-pay | Admitting: Internal Medicine

## 2022-04-05 ENCOUNTER — Encounter: Payer: Self-pay | Admitting: Internal Medicine

## 2022-04-05 VITALS — BP 114/72 | HR 75 | Temp 98.0°F | Resp 18 | Ht 72.0 in | Wt 261.1 lb

## 2022-04-05 DIAGNOSIS — E782 Mixed hyperlipidemia: Secondary | ICD-10-CM

## 2022-04-05 DIAGNOSIS — Z79899 Other long term (current) drug therapy: Secondary | ICD-10-CM

## 2022-04-05 MED ORDER — ROSUVASTATIN CALCIUM 5 MG PO TABS: 5.0000 mg | ORAL_TABLET | Freq: Every day | ORAL | 1 refills | Status: AC

## 2022-04-05 NOTE — Patient Instructions (Signed)
It was great seeing you today!  Plan discussed at today's visit: -Go back on the Crestor 5 mg - can wither cut the 10 mg dose in half or take one every other day. I am sending the 5 mg dose to your pharmacy -Let me know if you have any issues going back on this dose   Follow up in: already scheduled   Take care and let us know if you have any questions or concerns prior to your next visit.  Dr. Rosana Berger

## 2022-06-01 ENCOUNTER — Other Ambulatory Visit: Payer: Self-pay | Admitting: Internal Medicine

## 2022-06-01 DIAGNOSIS — G4700 Insomnia, unspecified: Secondary | ICD-10-CM

## 2022-06-01 DIAGNOSIS — F411 Generalized anxiety disorder: Secondary | ICD-10-CM

## 2022-06-01 NOTE — Telephone Encounter (Signed)
Requested Prescriptions  Pending Prescriptions Disp Refills   hydrOXYzine (ATARAX) 10 MG tablet [Pharmacy Med Name: hydrOXYzine HCl 10 MG Oral Tablet] 270 tablet 0    Sig: TAKE 1 TABLET BY MOUTH THREE TIMES DAILY AS NEEDED FOR ANXIETY     Ear, Nose, and Throat:  Antihistamines 2 Passed - 06/01/2022 11:02 AM      Passed - Cr in normal range and within 360 days    Creat  Date Value Ref Range Status  02/24/2022 1.01 0.70 - 1.30 mg/dL Final         Passed - Valid encounter within last 12 months    Recent Outpatient Visits           1 month ago Mixed hyperlipidemia   [place], DO   3 months ago Dyslipidemia   [facility] Teodora Medici, DO   9 months ago Dyslipidemia   [facility] Teodora Medici, DO   1 year ago Mixed hyperlipidemia   [facility] Teodora Medici, DO   1 year ago Hyperglycemia   [facility] Myles Gip, DO       Future Appointments             In 1 month Teodora Medici, [facility], [facility]

## 2022-06-19 ENCOUNTER — Other Ambulatory Visit: Payer: Self-pay | Admitting: Internal Medicine

## 2022-06-19 DIAGNOSIS — G4700 Insomnia, unspecified: Secondary | ICD-10-CM

## 2022-06-19 DIAGNOSIS — F411 Generalized anxiety disorder: Secondary | ICD-10-CM

## 2022-06-20 NOTE — Telephone Encounter (Signed)
Requested by interface surescripts. Duplicate request. Called pharmacy and Rx being filled now  Requested Prescriptions  Refused Prescriptions Disp Refills   hydrOXYzine (ATARAX) 10 MG tablet [Pharmacy Med Name: hydrOXYzine HCl 10 MG Oral Tablet] 90 tablet 0    Sig: TAKE 1 TABLET BY MOUTH THREE TIMES DAILY AS NEEDED FOR ANXIETY     Ear, Nose, and Throat:  Antihistamines 2 Passed - 06/19/2022 10:00 AM      Passed - Cr in normal range and within 360 days    Creat  Date Value Ref Range Status  02/24/2022 1.01 0.70 - 1.30 mg/dL Final         Passed - Valid encounter within last 12 months    Recent Outpatient Visits           2 months ago Mixed hyperlipidemia   [place], DO   3 months ago Dyslipidemia   [facility] Teodora Medici, DO   10 months ago Dyslipidemia   [facility] Teodora Medici, DO   1 year ago Mixed hyperlipidemia   [facility] Teodora Medici, DO   1 year ago Hyperglycemia   [facility] Myles Gip, DO       Future Appointments             In 1 month Teodora Medici, [facility], [facility]

## 2022-06-20 NOTE — Telephone Encounter (Signed)
Called pharmacy to verify status of medication refill. Pharmacy staff reports medication has not been picked up by patient and is being filled now.

## 2022-07-26 ENCOUNTER — Ambulatory Visit: Payer: Self-pay | Admitting: Internal Medicine
